# Patient Record
Sex: Female | Born: 1985 | Race: White | Hispanic: No | Marital: Single | Smoking: Current every day smoker
Health system: Southern US, Community
[De-identification: ages and names within clinical notes are randomized; demographics above are authoritative.]

## PROBLEM LIST (undated history)

## (undated) DIAGNOSIS — M26609 Unspecified temporomandibular joint disorder, unspecified side: Secondary | ICD-10-CM

## (undated) DIAGNOSIS — I10 Essential (primary) hypertension: Secondary | ICD-10-CM

---
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
## 2010-07-02 ENCOUNTER — Emergency Department (HOSPITAL_BASED_OUTPATIENT_CLINIC_OR_DEPARTMENT_OTHER)
Admission: EM | Admit: 2010-07-02 | Discharge: 2010-07-02 | Disposition: A | Discharge status: Home / Self Care | Payer: Self-pay | Attending: Emergency Medicine | Admitting: Emergency Medicine

## 2010-07-02 DIAGNOSIS — M545 Low back pain, unspecified: Secondary | ICD-10-CM | POA: Insufficient documentation

## 2012-04-06 ENCOUNTER — Encounter (HOSPITAL_BASED_OUTPATIENT_CLINIC_OR_DEPARTMENT_OTHER): Payer: Self-pay

## 2012-04-06 ENCOUNTER — Emergency Department (HOSPITAL_BASED_OUTPATIENT_CLINIC_OR_DEPARTMENT_OTHER)
Admission: EM | Admit: 2012-04-06 | Discharge: 2012-04-06 | Disposition: A | Discharge status: Home / Self Care | Payer: Self-pay | Attending: Emergency Medicine | Admitting: Emergency Medicine

## 2012-04-06 DIAGNOSIS — M26609 Unspecified temporomandibular joint disorder, unspecified side: Secondary | ICD-10-CM | POA: Insufficient documentation

## 2012-04-06 DIAGNOSIS — F17200 Nicotine dependence, unspecified, uncomplicated: Secondary | ICD-10-CM | POA: Insufficient documentation

## 2012-04-06 HISTORY — DX: Unspecified temporomandibular joint disorder, unspecified side: M26.609

## 2012-04-06 MED ORDER — IBUPROFEN 800 MG PO TABS: 800.0000 mg | ORAL_TABLET | Freq: Three times a day (TID) | ORAL | Status: DC | PRN

## 2012-04-06 MED ORDER — OXYCODONE-ACETAMINOPHEN 5-325 MG PO TABS
2.0000 | ORAL_TABLET | Freq: Once | ORAL | Status: AC
  Administered 2012-04-06: 2 via ORAL
  Filled 2012-04-06 (×2): qty 2

## 2012-04-06 MED ORDER — IBUPROFEN 800 MG PO TABS
800.0000 mg | ORAL_TABLET | Freq: Once | ORAL | Status: AC
  Administered 2012-04-06: 800 mg via ORAL
  Filled 2012-04-06: qty 1

## 2012-04-06 MED ORDER — OXYCODONE-ACETAMINOPHEN 5-325 MG PO TABS: 1.0000 | ORAL_TABLET | Freq: Four times a day (QID) | ORAL | Status: DC | PRN

## 2012-04-06 NOTE — ED Provider Notes (Signed)
History     CSN: [id number]  Arrival date & time 04/06/12  1213   First MD Initiated Contact with Patient 04/06/12 1231      Chief Complaint  Patient presents with  . Jaw Pain    (Consider location/radiation/quality/duration/timing/severity/associated sxs/prior treatment) HPI Pt with history of TMJ syndrome reports several days of severe aching pain in L TMJ, associated with clicking and worse with chewing. No swelling, fever, otalgia or drainage. Not improved with APAP at home.   Past Medical History  Diagnosis Date  . TMJ (dislocation of temporomandibular joint)     History reviewed. No pertinent past surgical history.  No family history on file.  History  Substance Use Topics  . Smoking status: Current Every Day Smoker  . Smokeless tobacco: Not on file  . Alcohol Use: No    OB History    Grav Para Term Preterm Abortions TAB SAB Ect Mult Living                  Review of Systems All other systems reviewed and are negative except as noted in HPI.   Allergies  Review of patient's allergies indicates no known allergies.  Home Medications   Current Outpatient Rx  Name  Route  Sig  Dispense  Refill  . ACETAMIN PO   Oral   Take by mouth.           BP 145/89  Pulse 83  Temp 97.7 F (36.5 C) (Oral)  Resp 16  Ht 5\' 4"  (1.626 m)  Wt 190 lb (86.183 kg)  BMI 32.61 kg/m2  SpO2 100%  LMP 03/14/2012  Physical Exam  Nursing note and vitals reviewed. Constitutional: She is oriented to person, place, and time. She appears well-developed and well-nourished.  HENT:  Head: Normocephalic and atraumatic.       TM normal, tender to L TMJ  Eyes: EOM are normal. Pupils are equal, round, and reactive to light.  Neck: Normal range of motion. Neck supple.  Cardiovascular: Normal rate, normal heart sounds and intact distal pulses.   Pulmonary/Chest: Effort normal and breath sounds normal.  Abdominal: Bowel sounds are normal. She exhibits no distension. There is no  tenderness.  Musculoskeletal: Normal range of motion. She exhibits no edema and no tenderness.  Neurological: She is alert and oriented to person, place, and time. She has normal strength. No cranial nerve deficit or sensory deficit.  Skin: Skin is warm and dry. No rash noted.  Psychiatric: She has a normal mood and affect.    ED Course  Procedures (including critical care time)  Labs Reviewed - No data to display No results found.   No diagnosis found.    MDM  Symptomatic relief and dental followup.         Charles B. Bernette Mayers, MD 04/06/12 1302

## 2012-04-06 NOTE — ED Notes (Signed)
Left side jaw pain "for weeks"-states she was dx with TMJ years ago

## 2012-10-09 ENCOUNTER — Encounter (HOSPITAL_BASED_OUTPATIENT_CLINIC_OR_DEPARTMENT_OTHER): Payer: Self-pay | Admitting: *Deleted

## 2012-10-09 ENCOUNTER — Emergency Department (HOSPITAL_BASED_OUTPATIENT_CLINIC_OR_DEPARTMENT_OTHER)
Admission: EM | Admit: 2012-10-09 | Discharge: 2012-10-09 | Disposition: A | Discharge status: Home / Self Care | Payer: Self-pay | Attending: Emergency Medicine | Admitting: Emergency Medicine

## 2012-10-09 ENCOUNTER — Emergency Department (HOSPITAL_BASED_OUTPATIENT_CLINIC_OR_DEPARTMENT_OTHER): Payer: Self-pay

## 2012-10-09 DIAGNOSIS — F17200 Nicotine dependence, unspecified, uncomplicated: Secondary | ICD-10-CM | POA: Insufficient documentation

## 2012-10-09 DIAGNOSIS — Y92009 Unspecified place in unspecified non-institutional (private) residence as the place of occurrence of the external cause: Secondary | ICD-10-CM | POA: Insufficient documentation

## 2012-10-09 DIAGNOSIS — IMO0002 Reserved for concepts with insufficient information to code with codable children: Secondary | ICD-10-CM | POA: Insufficient documentation

## 2012-10-09 DIAGNOSIS — S8391XA Sprain of unspecified site of right knee, initial encounter: Secondary | ICD-10-CM

## 2012-10-09 DIAGNOSIS — W010XXA Fall on same level from slipping, tripping and stumbling without subsequent striking against object, initial encounter: Secondary | ICD-10-CM | POA: Insufficient documentation

## 2012-10-09 DIAGNOSIS — Z79899 Other long term (current) drug therapy: Secondary | ICD-10-CM | POA: Insufficient documentation

## 2012-10-09 DIAGNOSIS — Z8739 Personal history of other diseases of the musculoskeletal system and connective tissue: Secondary | ICD-10-CM | POA: Insufficient documentation

## 2012-10-09 DIAGNOSIS — Y9389 Activity, other specified: Secondary | ICD-10-CM | POA: Insufficient documentation

## 2012-10-09 MED ORDER — OXYCODONE-ACETAMINOPHEN 5-325 MG PO TABS
2.0000 | ORAL_TABLET | Freq: Once | ORAL | Status: AC
  Administered 2012-10-09: 2 via ORAL
  Filled 2012-10-09 (×2): qty 2

## 2012-10-09 MED ORDER — NAPROXEN 500 MG PO TABS: 500.0000 mg | ORAL_TABLET | Freq: Two times a day (BID) | ORAL | Status: DC

## 2012-10-09 MED ORDER — OXYCODONE-ACETAMINOPHEN 5-325 MG PO TABS: 1.0000 | ORAL_TABLET | Freq: Four times a day (QID) | ORAL | Status: DC | PRN

## 2012-10-09 NOTE — ED Notes (Signed)
Pt states she was playing in yard with kids Sunday tripped and fell landing on right knee states she heard and felt it pop and give way. She has had swelling in knee and calf

## 2012-10-09 NOTE — ED Provider Notes (Signed)
History     CSN: [id number]  Arrival date & time 10/09/12  1241   First MD Initiated Contact with Patient 10/09/12 1254      Chief Complaint  Patient presents with  . Knee Pain    (Consider location/radiation/quality/duration/timing/severity/associated sxs/prior treatment) Patient is a 27 y.o. female presenting with knee pain. The history is provided by the patient. No language interpreter was used.  Knee Pain Location:  Knee Time since incident:  5 days Injury: yes   Mechanism of injury: fall   Fall:    Fall occurred:  Recreating/playing   Height of fall:  Ground level   Impact surface:  Grass   Point of impact:  Unable to specify   Entrapped after fall: no   Knee location:  R knee Pain details:    Quality:  Shooting   Radiates to:  Does not radiate   Severity:  Severe   Onset quality:  Sudden   Duration:  5 days   Timing:  Constant   Progression:  Worsening Chronicity:  New Dislocation: no   Foreign body present:  No foreign bodies Worsened by:  Bearing weight Ineffective treatments:  Rest Associated symptoms: decreased ROM and swelling     Past Medical History  Diagnosis Date  . TMJ (temporomandibular joint syndrome)     History reviewed. No pertinent past surgical history.  History reviewed. No pertinent family history.  History  Substance Use Topics  . Smoking status: Current Every Day Smoker  . Smokeless tobacco: Not on file  . Alcohol Use: No    OB History   Grav Para Term Preterm Abortions TAB SAB Ect Mult Living                  Review of Systems  Musculoskeletal: Positive for joint swelling and arthralgias.  All other systems reviewed and are negative.    Allergies  Review of patient's allergies indicates no known allergies.  Home Medications   Current Outpatient Rx  Name  Route  Sig  Dispense  Refill  . Acetaminophen (ACETAMIN PO)   Oral   Take by mouth.         Marland Kitchen ibuprofen (ADVIL,MOTRIN) 800 MG tablet   Oral   Take 1  tablet (800 mg total) by mouth every 8 (eight) hours as needed for pain.   30 tablet   0   . oxyCODONE-acetaminophen (PERCOCET/ROXICET) 5-325 MG per tablet   Oral   Take 1-2 tablets by mouth every 6 (six) hours as needed for pain.   20 tablet   0     BP 124/90  Pulse 89  Temp(Src) 98.5 F (36.9 C) (Oral)  SpO2 99%  LMP 09/11/2012  Physical Exam  Nursing note and vitals reviewed. Constitutional: She is oriented to person, place, and time. She appears well-developed and well-nourished.  HENT:  Head: Normocephalic and atraumatic.  Eyes: Pupils are equal, round, and reactive to light.  Neck: Normal range of motion.  Cardiovascular: Normal rate, regular rhythm and normal heart sounds.   Pulmonary/Chest: Effort normal and breath sounds normal.  Abdominal: Soft. Bowel sounds are normal.  Musculoskeletal: She exhibits edema and tenderness.       Right knee: She exhibits swelling. She exhibits no deformity. Tenderness found. MCL, LCL and patellar tendon tenderness noted.       Legs: Neurological: She is alert and oriented to person, place, and time.  Skin: Skin is warm and dry.  Psychiatric: She has a normal mood and affect.  Her behavior is normal. Judgment and thought content normal.    ED Course  Procedures (including critical care time)  Labs Reviewed - No data to display No results found.   No diagnosis found.  Joint effusion noted on knee films. Difficult to assess knee d/t pain, but suspect collateral ligament involvement.  Knee immobilizer, crutches, anti-inflammatory, orthopedic follow-up.  MDM          Jimmye Norman, NP 10/09/12 1625

## 2012-10-09 NOTE — ED Notes (Signed)
Pt refused crutches-states she has pair at home

## 2012-10-13 ENCOUNTER — Ambulatory Visit (INDEPENDENT_AMBULATORY_CARE_PROVIDER_SITE_OTHER): Payer: Self-pay | Admitting: Family Medicine

## 2012-10-13 ENCOUNTER — Encounter: Payer: Self-pay | Admitting: Family Medicine

## 2012-10-13 VITALS — BP 127/79 | HR 99 | Ht 64.0 in | Wt 180.0 lb

## 2012-10-13 DIAGNOSIS — M25561 Pain in right knee: Secondary | ICD-10-CM

## 2012-10-13 DIAGNOSIS — S8990XA Unspecified injury of unspecified lower leg, initial encounter: Secondary | ICD-10-CM

## 2012-10-13 DIAGNOSIS — M25569 Pain in unspecified knee: Secondary | ICD-10-CM

## 2012-10-13 DIAGNOSIS — S8991XA Unspecified injury of right lower leg, initial encounter: Secondary | ICD-10-CM

## 2012-10-13 MED ORDER — OXYCODONE-ACETAMINOPHEN 5-325 MG PO TABS: 1.0000 | ORAL_TABLET | Freq: Four times a day (QID) | ORAL | Status: DC | PRN

## 2012-10-13 NOTE — Patient Instructions (Addendum)
Your history and exam are consistent with an ACL and likely meniscal tear with this. We will move forward with an MRI - call us when this goes through so we can order it as fast as possible. Ice knee 15 minutes at a time 3-4 times a day. ACE wrap for compression. Knee immobilizer only if needed to help get around. Crutches as needed also. Aleve 2 tabs twice a day with food for pain and inflammation when you run out of the naproxen. Percocet as needed for severe pain. Start simple range of motion exercises twice a day. Straight leg raises 3 sets of 10 once a day also for strengthening.

## 2012-10-13 NOTE — ED Provider Notes (Signed)
Medical screening examination/treatment/procedure(s) were performed by non-physician practitioner and as supervising physician I was immediately available for consultation/collaboration.  Noya Santarelli, MD 10/13/12 1419 

## 2012-10-14 ENCOUNTER — Encounter: Payer: Self-pay | Admitting: Family Medicine

## 2012-10-14 DIAGNOSIS — S8991XA Unspecified injury of right lower leg, initial encounter: Secondary | ICD-10-CM | POA: Insufficient documentation

## 2012-10-14 NOTE — Assessment & Plan Note (Signed)
Concerning for meniscal and ACL injuries.  Lack of insurance is a major barrier to care -  Had medicaid and has reapplied.  Will call us when this goes through to go ahead with an MRI.  Radiographs in ED negative for fracture.  In meantime icing, ace wrap, immobilizer only if needed.  Elevation, aleve and percocet for pain.  Start simple ROM exercises and quad strengthening.

## 2012-10-14 NOTE — Progress Notes (Signed)
Patient ID: Robin Farmer, female   DOB: 1985-08-27, 27 y.o.   MRN: [id number]  PCP: No primary provider on file.  Subjective:   HPI: Patient is a 27 y.o. female here for right knee injury.  Patient reports on 5/25 she was chasing her child, running with flip flops on and stated she fell. Happened quickly so she's not completely sure how she injured herself. Believes feet went out from under her and she either twisted or hyperextended right knee. Unable to put weight on left leg after this due to knee pain. Immediate swelling that has gotten worse. Been icing and using crutches. Elevating. Taking ibuprofen and pain medication from ED. No prior knee injuries. No catching, locking but feels unstable.  Past Medical History  Diagnosis Date  . TMJ (temporomandibular joint syndrome)     Current Outpatient Prescriptions on File Prior to Visit  Medication Sig Dispense Refill  . Acetaminophen (ACETAMIN PO) Take by mouth.      Marland Kitchen ibuprofen (ADVIL,MOTRIN) 800 MG tablet Take 1 tablet (800 mg total) by mouth every 8 (eight) hours as needed for pain.  30 tablet  0  . naproxen (NAPROSYN) 500 MG tablet Take 1 tablet (500 mg total) by mouth 2 (two) times daily.  20 tablet  0   No current facility-administered medications on file prior to visit.    History reviewed. No pertinent past surgical history.  No Known Allergies  History   Social History  . Marital Status: Single    Spouse Name: N/A    Number of Children: N/A  . Years of Education: N/A   Occupational History  . Not on file.   Social History Main Topics  . Smoking status: Current Every Day Smoker  . Smokeless tobacco: Not on file  . Alcohol Use: No  . Drug Use: Not on file  . Sexually Active: Not on file   Other Topics Concern  . Not on file   Social History Narrative  . No narrative on file    Family History  Problem Relation Age of Onset  . Hyperlipidemia Father   . Hypertension Father   . Diabetes Neg Hx    . Heart attack Neg Hx   . Sudden death Neg Hx     BP 127/79  Pulse 99  Ht 5\' 4"  (1.626 m)  Wt 180 lb (81.647 kg)  BMI 30.88 kg/m2  LMP 09/11/2012  Review of Systems: See HPI above.    Objective:  Physical Exam:  Gen: NAD though tearful during exam.  R knee: Mod effusion.  No warmth, erythema. TTP diffusely across anterior knee especially at joint lines. ROM 0-90 degrees. 1+ ant drawer and lachmanns but also guarding.  Negative post drawer. Negative valgus/varus testing. Negative lachmanns. Pain with mcmurrays and apleys but not localized to one joint line. Negative patellar apprehension, clarkes. NV intact distally.    Assessment & Plan:  1. Right knee injury - Concerning for meniscal and ACL injuries.  Lack of insurance is a major barrier to care -  Had medicaid and has reapplied.  Will call us when this goes through to go ahead with an MRI.  Radiographs in ED negative for fracture.  In meantime icing, ace wrap, immobilizer only if needed.  Elevation, aleve and percocet for pain.  Start simple ROM exercises and quad strengthening.

## 2012-10-20 ENCOUNTER — Ambulatory Visit: Payer: Self-pay

## 2012-11-04 ENCOUNTER — Encounter: Payer: Self-pay | Admitting: Family Medicine

## 2012-11-04 ENCOUNTER — Ambulatory Visit (INDEPENDENT_AMBULATORY_CARE_PROVIDER_SITE_OTHER): Payer: Self-pay | Admitting: Family Medicine

## 2012-11-04 VITALS — BP 125/84 | HR 89 | Ht 64.0 in | Wt 180.0 lb

## 2012-11-04 DIAGNOSIS — S8991XD Unspecified injury of right lower leg, subsequent encounter: Secondary | ICD-10-CM

## 2012-11-04 DIAGNOSIS — Z5189 Encounter for other specified aftercare: Secondary | ICD-10-CM

## 2012-11-04 NOTE — Assessment & Plan Note (Signed)
Concerning for meniscal and ACL injuries.  Lack of insurance is a major barrier to care -  Waiting on medicaid to go through and waiting on paperwork to file for cone coverage.  Will call us when this goes through to go ahead with an MRI.  Radiographs in ED negative for fracture.  In meantime icing, ace wrap, immobilizer only if needed.  Elevation, aleve for pain.  Injection given today.  ROM exercises and quad strengthening.  After informed written consent, patient was seated on exam table. Right knee was prepped with alcohol swab and utilizing anteromedial approach, patient's right knee was injected intraarticularly with 3:1 marcaine: depomedrol. Patient tolerated the procedure well without immediate complications.

## 2012-11-04 NOTE — Progress Notes (Signed)
Patient ID: Robin Farmer, female   DOB: 1985-08-31, 27 y.o.   MRN: [id number]  PCP: No primary provider on file.  Subjective:   HPI: Patient is a 27 y.o. female here for f/u right knee injury.  6/2: Patient reports on 5/25 she was chasing her child, running with flip flops on and stated she fell. Happened quickly so she's not completely sure how she injured herself. Believes feet went out from under her and she either twisted or hyperextended right knee. Unable to put weight on left leg after this due to knee pain. Immediate swelling that has gotten worse. Been icing and using crutches. Elevating. Taking ibuprofen and pain medication from ED. No prior knee injuries. No catching, locking but feels unstable.  6/24: Patient returns today as still feels about the same, has not gone through with cone coverage yet - states is waiting on a paper from child support.  Medicaid is still pending. Unable to get MRI yet - discussed tramadol or injection - has tramadol at home - here for injection.  Past Medical History  Diagnosis Date  . TMJ (temporomandibular joint syndrome)     Current Outpatient Prescriptions on File Prior to Visit  Medication Sig Dispense Refill  . Acetaminophen (ACETAMIN PO) Take by mouth.      Marland Kitchen ibuprofen (ADVIL,MOTRIN) 800 MG tablet Take 1 tablet (800 mg total) by mouth every 8 (eight) hours as needed for pain.  30 tablet  0  . naproxen (NAPROSYN) 500 MG tablet Take 1 tablet (500 mg total) by mouth 2 (two) times daily.  20 tablet  0  . oxyCODONE-acetaminophen (PERCOCET/ROXICET) 5-325 MG per tablet Take 1 tablet by mouth every 6 (six) hours as needed for pain.  60 tablet  0   No current facility-administered medications on file prior to visit.    History reviewed. No pertinent past surgical history.  No Known Allergies  History   Social History  . Marital Status: Single    Spouse Name: N/A    Number of Children: N/A  . Years of Education: N/A    Occupational History  . Not on file.   Social History Main Topics  . Smoking status: Current Every Day Smoker  . Smokeless tobacco: Not on file  . Alcohol Use: No  . Drug Use: Not on file  . Sexually Active: Not on file   Other Topics Concern  . Not on file   Social History Narrative  . No narrative on file    Family History  Problem Relation Age of Onset  . Hyperlipidemia Father   . Hypertension Father   . Diabetes Neg Hx   . Heart attack Neg Hx   . Sudden death Neg Hx     BP 125/84  Pulse 89  Ht 5\' 4"  (1.626 m)  Wt 180 lb (81.647 kg)  BMI 30.88 kg/m2  LMP 09/11/2012  Review of Systems: See HPI above.    Objective:  Physical Exam: Exam not repeated today. Gen: NAD though tearful during exam.  R knee: Mod effusion.  No warmth, erythema. TTP diffusely across anterior knee especially at joint lines. ROM 0-90 degrees. 1+ ant drawer and lachmanns but also guarding.  Negative post drawer. Negative valgus/varus testing. Negative lachmanns. Pain with mcmurrays and apleys but not localized to one joint line. Negative patellar apprehension, clarkes. NV intact distally.  MSK u/s: Minimal effusion.  Assessment & Plan:  1. Right knee injury - Concerning for meniscal and ACL injuries.  Lack of  insurance is a major barrier to care -  Waiting on medicaid to go through and waiting on paperwork to file for cone coverage.  Will call us when this goes through to go ahead with an MRI.  Radiographs in ED negative for fracture.  In meantime icing, ace wrap, immobilizer only if needed.  Elevation, aleve for pain.  Injection given today.  ROM exercises and quad strengthening.  After informed written consent, patient was seated on exam table. Right knee was prepped with alcohol swab and utilizing anteromedial approach, patient's right knee was injected intraarticularly with 3:1 marcaine: depomedrol. Patient tolerated the procedure well without immediate complications.

## 2013-11-26 ENCOUNTER — Emergency Department (HOSPITAL_BASED_OUTPATIENT_CLINIC_OR_DEPARTMENT_OTHER)
Admission: EM | Admit: 2013-11-26 | Discharge: 2013-11-26 | Disposition: A | Discharge status: Home / Self Care | Payer: Self-pay | Attending: Emergency Medicine | Admitting: Emergency Medicine

## 2013-11-26 ENCOUNTER — Encounter (HOSPITAL_BASED_OUTPATIENT_CLINIC_OR_DEPARTMENT_OTHER): Payer: Self-pay | Admitting: Emergency Medicine

## 2013-11-26 DIAGNOSIS — M545 Low back pain, unspecified: Secondary | ICD-10-CM | POA: Insufficient documentation

## 2013-11-26 DIAGNOSIS — M25561 Pain in right knee: Secondary | ICD-10-CM

## 2013-11-26 DIAGNOSIS — Z8719 Personal history of other diseases of the digestive system: Secondary | ICD-10-CM | POA: Insufficient documentation

## 2013-11-26 DIAGNOSIS — M5442 Lumbago with sciatica, left side: Secondary | ICD-10-CM

## 2013-11-26 DIAGNOSIS — F17200 Nicotine dependence, unspecified, uncomplicated: Secondary | ICD-10-CM | POA: Insufficient documentation

## 2013-11-26 DIAGNOSIS — Z791 Long term (current) use of non-steroidal anti-inflammatories (NSAID): Secondary | ICD-10-CM | POA: Insufficient documentation

## 2013-11-26 MED ORDER — OXYCODONE-ACETAMINOPHEN 5-325 MG PO TABS: 1.0000 | ORAL_TABLET | Freq: Four times a day (QID) | ORAL | Status: DC | PRN

## 2013-11-26 MED ORDER — HYDROMORPHONE HCL PF 2 MG/ML IJ SOLN
2.0000 mg | Freq: Once | INTRAMUSCULAR | Status: AC
  Administered 2013-11-26: 2 mg via INTRAMUSCULAR
  Filled 2013-11-26: qty 1

## 2013-11-26 NOTE — ED Provider Notes (Signed)
CSN: [id number]     Arrival date & time 11/26/13  0509 History   First MD Initiated Contact with Patient 11/26/13 [phone]     Chief Complaint  Patient presents with  . Back Pain     (Consider location/radiation/quality/duration/timing/severity/associated sxs/prior Treatment) HPI This is a 28 year old female with a remote history of back pain. She is here with a two-day history of pain across her lower back sometimes radiating to her left lateral thigh. She is unaware of any injury that triggered this although she does lift frequently at work. She states the pain is severe and has prevented her from sleeping for the past 2 nights. There is no associated numbness or weakness. There are no associated bowel or bladder changes. She has difficulty finding a comfortable position. She has been taking Tylenol without relief. She does not like taking ibuprofen because it makes her feel funny.   Past Medical History  Diagnosis Date  . TMJ (temporomandibular joint syndrome)    History reviewed. No pertinent past surgical history. Family History  Problem Relation Age of Onset  . Hyperlipidemia Father   . Hypertension Father   . Diabetes Neg Hx   . Heart attack Neg Hx   . Sudden death Neg Hx    History  Substance Use Topics  . Smoking status: Current Every Day Smoker  . Smokeless tobacco: Not on file  . Alcohol Use: No   OB History   Grav Para Term Preterm Abortions TAB SAB Ect Mult Living                 Review of Systems  All other systems reviewed and are negative.   Allergies  Review of patient's allergies indicates no known allergies.  Home Medications   Prior to Admission medications   Medication Sig Start Date End Date Taking? Authorizing Provider  Acetaminophen (ACETAMIN PO) Take by mouth.    Historical Provider, MD  ibuprofen (ADVIL,MOTRIN) 800 MG tablet Take 1 tablet (800 mg total) by mouth every 8 (eight) hours as needed for pain. 04/06/12   Charles B. Bernette MayersSheldon, MD   naproxen (NAPROSYN) 500 MG tablet Take 1 tablet (500 mg total) by mouth 2 (two) times daily. 10/09/12   Jimmye Normanavid Nilza Eaker Smith, NP  oxyCODONE-acetaminophen (PERCOCET/ROXICET) 5-325 MG per tablet Take 1 tablet by mouth every 6 (six) hours as needed for pain. 10/13/12   Lenda KelpShane R Hudnall, MD   BP 129/86  Pulse 70  Temp(Src) 97.9 F (36.6 C) (Oral)  Resp 20  Ht 5\' 4"  (1.626 m)  Wt 195 lb (88.451 kg)  BMI 33.46 kg/m2  SpO2 100%  LMP 11/19/2013  Physical Exam General: Well-developed, well-nourished female in no acute distress; appearance consistent with age of record HENT: normocephalic; atraumatic Eyes: pupils equal, round and reactive to light; extraocular muscles intact Neck: supple Heart: regular rate and rhythm Lungs: clear to auscultation bilaterally Abdomen: soft; nondistended; nontender Back: Bilateral lower back tenderness; pain on straight leg raise right at about 75; pain on straight leg raise left at about 60 Extremities: No deformity; full range of motion; pulses normal Neurologic: Awake, alert and oriented; motor function intact in all extremities and symmetric; sensation intact in lower extremities and symmetric; no facial droop Skin: Warm and dry Psychiatric: Distracted by pain; difficulty focusing on answering questions in detail   ED Course  Procedures (including critical care time)  MDM       Hanley SeamenJohn L Ethne Jeon, MD 11/26/13 [phone]

## 2013-11-26 NOTE — ED Notes (Signed)
Pt c/o lower back pain radiating down lt leg x2days, denies injury

## 2014-06-24 ENCOUNTER — Emergency Department (HOSPITAL_BASED_OUTPATIENT_CLINIC_OR_DEPARTMENT_OTHER)
Admission: EM | Admit: 2014-06-24 | Discharge: 2014-06-24 | Disposition: A | Discharge status: Home / Self Care | Payer: Self-pay | Attending: Emergency Medicine | Admitting: Emergency Medicine

## 2014-06-24 ENCOUNTER — Encounter (HOSPITAL_BASED_OUTPATIENT_CLINIC_OR_DEPARTMENT_OTHER): Payer: Self-pay

## 2014-06-24 DIAGNOSIS — M5432 Sciatica, left side: Secondary | ICD-10-CM | POA: Insufficient documentation

## 2014-06-24 DIAGNOSIS — Z791 Long term (current) use of non-steroidal anti-inflammatories (NSAID): Secondary | ICD-10-CM | POA: Insufficient documentation

## 2014-06-24 DIAGNOSIS — Z720 Tobacco use: Secondary | ICD-10-CM | POA: Insufficient documentation

## 2014-06-24 MED ORDER — OXYCODONE-ACETAMINOPHEN 5-325 MG PO TABS: 1.0000 | ORAL_TABLET | Freq: Four times a day (QID) | ORAL | Status: DC | PRN

## 2014-06-24 MED ORDER — HYDROMORPHONE HCL 1 MG/ML IJ SOLN
1.0000 mg | Freq: Once | INTRAMUSCULAR | Status: AC
  Administered 2014-06-24: 1 mg via INTRAMUSCULAR
  Filled 2014-06-24: qty 1

## 2014-06-24 NOTE — ED Notes (Signed)
Pt reports intermittent lower back pain for one week. Denies known injury. States sharp pain across lower back with radiation through posterior left thigh.

## 2014-06-24 NOTE — ED Provider Notes (Signed)
CSN: [id number]     Arrival date & time 06/24/14  [id number] History   First MD Initiated Contact with Patient 06/24/14 [phone]     Chief Complaint  Patient presents with  . Back Pain     (Consider location/radiation/quality/duration/timing/severity/associated sxs/prior Treatment) Patient is a 29 y.o. female presenting with back pain. The history is provided by the patient.  Back Pain Location:  Lumbar spine Quality:  Aching Radiates to:  L thigh Pain severity:  Moderate Pain is:  Same all the time Onset quality:  Gradual Duration:  1 week Timing:  Constant Progression:  Unchanged Chronicity:  Recurrent Context comment:  Chronic recurrent low back pain Relieved by:  Nothing Worsened by:  Movement Ineffective treatments: tylenol. Associated symptoms: no abdominal pain, no chest pain, no dysuria, no fever and no headaches     Past Medical History  Diagnosis Date  . TMJ (temporomandibular joint syndrome)    History reviewed. No pertinent past surgical history. Family History  Problem Relation Age of Onset  . Hyperlipidemia Father   . Hypertension Father   . Diabetes Neg Hx   . Heart attack Neg Hx   . Sudden death Neg Hx    History  Substance Use Topics  . Smoking status: Current Every Day Smoker  . Smokeless tobacco: Not on file  . Alcohol Use: No   OB History    No data available     Review of Systems  Constitutional: Negative for fever and fatigue.  HENT: Negative for congestion and drooling.   Eyes: Negative for pain.  Respiratory: Negative for cough and shortness of breath.   Cardiovascular: Negative for chest pain.  Gastrointestinal: Negative for nausea, vomiting, abdominal pain and diarrhea.  Genitourinary: Negative for dysuria and hematuria.  Musculoskeletal: Positive for back pain. Negative for gait problem and neck pain.  Skin: Negative for color change.  Neurological: Negative for dizziness and headaches.  Hematological: Negative for adenopathy.   Psychiatric/Behavioral: Negative for behavioral problems.  All other systems reviewed and are negative.     Allergies  Review of patient's allergies indicates no known allergies.  Home Medications   Prior to Admission medications   Medication Sig Start Date End Date Taking? Authorizing Provider  Acetaminophen (ACETAMIN PO) Take by mouth.    Historical Provider, MD  ibuprofen (ADVIL,MOTRIN) 800 MG tablet Take 1 tablet (800 mg total) by mouth every 8 (eight) hours as needed for pain. 04/06/12   Charles B. Bernette MayersSheldon, MD  naproxen (NAPROSYN) 500 MG tablet Take 1 tablet (500 mg total) by mouth 2 (two) times daily. 10/09/12   Jimmye Normanavid John Smith, NP  oxyCODONE-acetaminophen (PERCOCET) 5-325 MG per tablet Take 1 tablet by mouth every 6 (six) hours as needed for moderate pain. 06/24/14   Purvis SheffieldForrest Jaely Silman, MD   BP 137/90 mmHg  Pulse 92  Temp(Src) 97.7 F (36.5 C)  Resp 20  Ht 5\' 4"  (1.626 m)  Wt 214 lb 14.4 oz (97.478 kg)  BMI 36.87 kg/m2  SpO2 100% Physical Exam  Constitutional: She is oriented to person, place, and time. She appears well-developed and well-nourished.  HENT:  Head: Normocephalic.  Mouth/Throat: No oropharyngeal exudate.  Eyes: Conjunctivae and EOM are normal. Pupils are equal, round, and reactive to light.  Neck: Normal range of motion. Neck supple.  Cardiovascular: Normal rate, regular rhythm, normal heart sounds and intact distal pulses.  Exam reveals no gallop and no friction rub.   No murmur heard. Pulmonary/Chest: Effort normal and breath sounds normal. No respiratory distress.  She has no wheezes.  Abdominal: Soft. Bowel sounds are normal. There is no tenderness. There is no rebound and no guarding.  Musculoskeletal: Normal range of motion. She exhibits tenderness. She exhibits no edema.  Diffuse tenderness of the lower lumbar spine and lumbar paraspinal area bilaterally.  Normal strength and sensation in bilateral lower extremities.  Neurological: She is alert and  oriented to person, place, and time.  Reflex Scores:      Patellar reflexes are 2+ on the right side and 2+ on the left side.      Achilles reflexes are 2+ on the right side and 2+ on the left side. Skin: Skin is warm and dry.  Psychiatric: She has a normal mood and affect. Her behavior is normal.  Nursing note and vitals reviewed.   ED Course  Procedures (including critical care time) Labs Review Labs Reviewed - No data to display  Imaging Review No results found.   EKG Interpretation None      MDM   Final diagnoses:  Sciatica, left    9:22 AM 29 y.o. female with a history of left-sided sciatica pain presents with low back pain radiating down the posterior aspect of her left thigh. She also has some mild paresthesias of the posterior left thigh. She denies any fevers, weakness, bowel or bladder incontinence, or other back pain red flags. She is ambulatory and has normal strength on exam. She's been seen for this once previously here.  She denies any specific injury. Do not think plain films of the helpful. Recommended follow-up with a primary care doctor. Back pain red flags discussed and recommend return for any worsening.    Purvis Sheffield, MD 06/24/14 [phone]

## 2014-06-24 NOTE — Discharge Instructions (Signed)
Back Pain, Adult °Back pain is very common. The pain often gets better over time. The cause of back pain is usually not dangerous. Most people can learn to manage their back pain on their own.  °HOME CARE  °· Stay active. Start with short walks on flat ground if you can. Try to walk farther each day. °· Do not sit, drive, or stand in one place for more than 30 minutes. Do not stay in bed. °· Do not avoid exercise or work. Activity can help your back heal faster. °· Be careful when you bend or lift an object. Bend at your knees, keep the object close to you, and do not twist. °· Sleep on a firm mattress. Lie on your side, and bend your knees. If you lie on your back, put a pillow under your knees. °· Only take medicines as told by your doctor. °· Put ice on the injured area. °¨ Put ice in a plastic bag. °¨ Place a towel between your skin and the bag. °¨ Leave the ice on for 15-20 minutes, 03-04 times a day for the first 2 to 3 days. After that, you can switch between ice and heat packs. °· Ask your doctor about back exercises or massage. °· Avoid feeling anxious or stressed. Find good ways to deal with stress, such as exercise. °GET HELP RIGHT AWAY IF:  °· Your pain does not go away with rest or medicine. °· Your pain does not go away in 1 week. °· You have new problems. °· You do not feel well. °· The pain spreads into your legs. °· You cannot control when you poop (bowel movement) or pee (urinate). °· Your arms or legs feel weak or lose feeling (numbness). °· You feel sick to your stomach (nauseous) or throw up (vomit). °· You have belly (abdominal) pain. °· You feel like you may pass out (faint). °MAKE SURE YOU:  °· Understand these instructions. °· Will watch your condition. °· Will get help right away if you are not doing well or get worse. °Document Released: 10/17/2007 Document Revised: 07/23/2011 Document Reviewed: 09/01/2013 °Exit[employer]® Patient Information ©2015 [employer]. This information is not intended  to replace advice given to you by your health care provider. Make sure you discuss any questions you have with your health care provider. ° °

## 2015-03-03 ENCOUNTER — Encounter (HOSPITAL_BASED_OUTPATIENT_CLINIC_OR_DEPARTMENT_OTHER): Payer: Self-pay | Admitting: *Deleted

## 2015-03-03 ENCOUNTER — Emergency Department (HOSPITAL_BASED_OUTPATIENT_CLINIC_OR_DEPARTMENT_OTHER)
Admission: EM | Admit: 2015-03-03 | Discharge: 2015-03-03 | Disposition: A | Discharge status: Home / Self Care | Payer: Self-pay | Attending: Emergency Medicine | Admitting: Emergency Medicine

## 2015-03-03 ENCOUNTER — Emergency Department (HOSPITAL_BASED_OUTPATIENT_CLINIC_OR_DEPARTMENT_OTHER): Payer: Self-pay

## 2015-03-03 DIAGNOSIS — Z791 Long term (current) use of non-steroidal anti-inflammatories (NSAID): Secondary | ICD-10-CM | POA: Insufficient documentation

## 2015-03-03 DIAGNOSIS — Y9389 Activity, other specified: Secondary | ICD-10-CM | POA: Insufficient documentation

## 2015-03-03 DIAGNOSIS — W108XXA Fall (on) (from) other stairs and steps, initial encounter: Secondary | ICD-10-CM | POA: Insufficient documentation

## 2015-03-03 DIAGNOSIS — Z720 Tobacco use: Secondary | ICD-10-CM | POA: Insufficient documentation

## 2015-03-03 DIAGNOSIS — Z8739 Personal history of other diseases of the musculoskeletal system and connective tissue: Secondary | ICD-10-CM | POA: Insufficient documentation

## 2015-03-03 DIAGNOSIS — S8391XA Sprain of unspecified site of right knee, initial encounter: Secondary | ICD-10-CM | POA: Insufficient documentation

## 2015-03-03 DIAGNOSIS — Y998 Other external cause status: Secondary | ICD-10-CM | POA: Insufficient documentation

## 2015-03-03 DIAGNOSIS — Y9289 Other specified places as the place of occurrence of the external cause: Secondary | ICD-10-CM | POA: Insufficient documentation

## 2015-03-03 MED ORDER — HYDROCODONE-ACETAMINOPHEN 5-325 MG PO TABS: ORAL_TABLET | ORAL | Status: DC

## 2015-03-03 MED ORDER — OXYCODONE-ACETAMINOPHEN 5-325 MG PO TABS
1.0000 | ORAL_TABLET | Freq: Once | ORAL | Status: AC
Start: 2015-03-03 — End: 2015-03-03
  Administered 2015-03-03: 1 via ORAL
  Filled 2015-03-03: qty 1

## 2015-03-03 NOTE — ED Provider Notes (Signed)
CSN: [id number]     Arrival date & time 03/03/15  1337 History   First MD Initiated Contact with Patient 03/03/15 1401     Chief Complaint  Patient presents with  . Knee Pain     (Consider location/radiation/quality/duration/timing/severity/associated sxs/prior Treatment) HPI  Blood pressure 136/93, pulse 77, temperature 98.2 F (36.8 C), temperature source Oral, resp. rate 18, height  (1.626 m), weight 214 lb (97.07 kg), last menstrual period 02/10/2015, SpO2 98 %.  Robin Farmer is a 29 y.o. female complaining of severe pain to right knee after patient slipped and fell while carrying a couch up wooden steps several hours ago. Patient has not had any pain medication taken prior to arrival. She is ambulatory but with pain. She had a prior meniscal injury to the right knee that was treated without surgery. She rates her pain at 9 out of 10 and states is exacerbated by moving and climbing steps.  Past Medical History  Diagnosis Date  . TMJ (temporomandibular joint syndrome)    History reviewed. No pertinent past surgical history. Family History  Problem Relation Age of Onset  . Hyperlipidemia Father   . Hypertension Father   . Diabetes Neg Hx   . Heart attack Neg Hx   . Sudden death Neg Hx    Social History  Substance Use Topics  . Smoking status: Current Every Day Smoker  . Smokeless tobacco: None  . Alcohol Use: No   OB History    No data available     Review of Systems  10 systems reviewed and found to be negative, except as noted in the HPI.  Allergies  Review of patient's allergies indicates no known allergies.  Home Medications   Prior to Admission medications   Medication Sig Start Date End Date Taking? Authorizing Provider  Acetaminophen (ACETAMIN PO) Take by mouth.    Historical Provider, MD  ibuprofen (ADVIL,MOTRIN) 800 MG tablet Take 1 tablet (800 mg total) by mouth every 8 (eight) hours as needed for pain. 04/06/12   Susy Frizzle, MD   naproxen (NAPROSYN) 500 MG tablet Take 1 tablet (500 mg total) by mouth 2 (two) times daily. 10/09/12   Felicie Morn, NP  oxyCODONE-acetaminophen (PERCOCET) 5-325 MG per tablet Take 1 tablet by mouth every 6 (six) hours as needed for moderate pain. 06/24/14   Purvis Sheffield, MD   BP 139/92 mmHg  Pulse 97  Temp(Src) 98.2 F (36.8 C) (Oral)  Resp 20  Ht  (1.626 m)  Wt 214 lb (97.07 kg)  BMI 36.72 kg/m2  SpO2 100%  LMP 02/10/2015 Physical Exam  Constitutional: She is oriented to person, place, and time. She appears well-developed and well-nourished. No distress.  HENT:  Head: Normocephalic and atraumatic.  Mouth/Throat: Oropharynx is clear and moist.  Eyes: Conjunctivae and EOM are normal. Pupils are equal, round, and reactive to light.  Neck: Normal range of motion.  Cardiovascular: Normal rate and regular rhythm.   Pulmonary/Chest: Effort normal and breath sounds normal. No stridor. No respiratory distress. She has no wheezes. She has no rales. She exhibits no tenderness.  Abdominal: Soft. Bowel sounds are normal. She exhibits no distension and no mass. There is no tenderness. There is no rebound and no guarding.  Musculoskeletal: Normal range of motion. She exhibits tenderness.  Right knee:  No deformity, erythema or abrasions. Diffuse nascent contusion, very tender to palpation along the medial joint line. FROM with pain. Marland Kitchen Anterior and posterior drawer show no abnormal laxity. Stable  to valgus and varus stress. Joint lines are non-tender. Neurovascularly intact.    Neurological: She is alert and oriented to person, place, and time.  Psychiatric: She has a normal mood and affect.  Nursing note and vitals reviewed.   ED Course  Procedures (including critical care time) Labs Review Labs Reviewed - No data to display  Imaging Review No results found. I have personally reviewed and evaluated these images and lab results as part of my medical decision-making.   EKG  Interpretation None      MDM   Final diagnoses:  Right knee sprain, initial encounter    Filed Vitals:   03/03/15 1354 03/03/15 1453  BP: 139/92 136/93  Pulse: 97 77  Temp: 98.2 F (36.8 C)   TempSrc: Oral   Resp: 20 18  Height: 5\' 4"  (1.626 m)   Weight: 214 lb (97.07 kg)   SpO2: 100% 98%    Medications  oxyCODONE-acetaminophen (PERCOCET/ROXICET) 5-325 MG per tablet 1 tablet (1 tablet Oral Given 03/03/15 1450)    Robin Farmer is 29 y.o. female presenting with right knee pain and contusion. Distally neurovascularly intact. X-ray negative. Pain is severe. Will give knee immobilizer crutches and sports medicine referral.   Evaluation does not show pathology that would require ongoing emergent intervention or inpatient treatment. Pt is hemodynamically stable and mentating appropriately. Discussed findings and plan with patient/guardian, who agrees with care plan. All questions answered. Return precautions discussed and outpatient follow up given.   Discharge Medication List as of 03/03/2015  2:38 PM    START taking these medications   Details  HYDROcodone-acetaminophen (NORCO/VICODIN) 5-325 MG tablet Take 1-2 tablets by mouth every 6 hours as needed for pain and/or cough., Print             Robin Emeryicole Elijio Staples, PA-C 03/03/15 1527  Mirian MoMatthew Gentry, MD 03/10/15 [phone]

## 2015-03-03 NOTE — Discharge Instructions (Signed)
Rest, Ice intermittently (in the first 24-48 hours), Gentle compression with an Ace wrap, and elevate (Limb above the level of the heart)   Take up to 800mg  of ibuprofen (that is usually 4 over the counter pills)  3 times a day for 5 days. Take with food.  Take vicodin for breakthrough pain, do not drink alcohol, drive, care for children or do other critical tasks while taking vicodin.   Knee Sprain A knee sprain is a tear in one of the strong, fibrous tissues that connect the bones (ligaments) in your knee. The severity of the sprain depends on how much of the ligament is torn. The tear can be either partial or complete. CAUSES  Often, sprains are a result of a fall or injury. The force of the impact causes the fibers of your ligament to stretch too much. This excess tension causes the fibers of your ligament to tear. SIGNS AND SYMPTOMS  You may have some loss of motion in your knee. Other symptoms include:  Bruising.  Pain in the knee area.  Tenderness of the knee to the touch.  Swelling. DIAGNOSIS  To diagnose a knee sprain, your health care provider will physically examine your knee. Your health care provider may also suggest an X-ray exam of your knee to make sure no bones are broken. TREATMENT  If your ligament is only partially torn, treatment usually involves keeping the knee in a fixed position (immobilization) or bracing your knee for activities that require movement for several weeks. To do this, your health care provider will apply a bandage, cast, or splint to keep your knee from moving and to support your knee during movement until it heals. For a partially torn ligament, the healing process usually takes 4-6 weeks. If your ligament is completely torn, depending on which ligament it is, you may need surgery to reconnect the ligament to the bone or reconstruct it. After surgery, a cast or splint may be applied and will need to stay on your knee for 4-6 weeks while your ligament  heals. HOME CARE INSTRUCTIONS  Keep your injured knee elevated to decrease swelling.  To ease pain and swelling, apply ice to the injured area:  Put ice in a plastic bag.  Place a towel between your skin and the bag.  Leave the ice on for 20 minutes, 2-3 times a day.  Only take medicine for pain as directed by your health care provider.  Do not leave your knee unprotected until pain and stiffness go away (usually 4-6 weeks).  If you have a cast or splint, do not allow it to get wet. If you have been instructed not to remove it, cover it with a plastic bag when you shower or bathe. Do not swim.  Your health care provider may suggest exercises for you to do during your recovery to prevent or limit permanent weakness and stiffness. SEEK IMMEDIATE MEDICAL CARE IF:  Your cast or splint becomes damaged.  Your pain becomes worse.  You have significant pain, swelling, or numbness below the cast or splint. MAKE SURE YOU:  Understand these instructions.  Will watch your condition.  Will get help right away if you are not doing well or get worse.   This information is not intended to replace advice given to you by your health care provider. Make sure you discuss any questions you have with your health care provider.   Document Released: 04/30/2005 Document Revised: 05/21/2014 Document Reviewed: 12/10/2012 Elsevier Interactive Patient Education 2016  Elsevier [employer]. ° °

## 2015-03-03 NOTE — ED Notes (Signed)
Right knee injury. She fell on a wooden step while moving furniture up the steps. Swelling and pain.

## 2015-03-03 NOTE — ED Notes (Signed)
Patient transported to and from x-ray.

## 2015-10-03 ENCOUNTER — Encounter (HOSPITAL_BASED_OUTPATIENT_CLINIC_OR_DEPARTMENT_OTHER): Payer: Self-pay | Admitting: *Deleted

## 2015-10-03 DIAGNOSIS — F17200 Nicotine dependence, unspecified, uncomplicated: Secondary | ICD-10-CM | POA: Insufficient documentation

## 2015-10-03 DIAGNOSIS — N12 Tubulo-interstitial nephritis, not specified as acute or chronic: Secondary | ICD-10-CM | POA: Insufficient documentation

## 2015-10-03 LAB — URINALYSIS, ROUTINE W REFLEX MICROSCOPIC
BILIRUBIN URINE: NEGATIVE
Glucose, UA: NEGATIVE mg/dL
KETONES UR: NEGATIVE mg/dL
Nitrite: NEGATIVE
Protein, ur: 100 mg/dL — AB
SPECIFIC GRAVITY, URINE: 1.009 (ref 1.005–1.030)
pH: 6 (ref 5.0–8.0)

## 2015-10-03 LAB — URINE MICROSCOPIC-ADD ON

## 2015-10-03 LAB — PREGNANCY, URINE: PREG TEST UR: NEGATIVE

## 2015-10-03 MED ORDER — ACETAMINOPHEN 325 MG PO TABS
650.0000 mg | ORAL_TABLET | Freq: Once | ORAL | Status: AC
  Administered 2015-10-03: 650 mg via ORAL
  Filled 2015-10-03: qty 2

## 2015-10-03 NOTE — ED Notes (Signed)
Left flank pain that started today.  Pt tearful in triage.

## 2015-10-04 ENCOUNTER — Emergency Department (HOSPITAL_BASED_OUTPATIENT_CLINIC_OR_DEPARTMENT_OTHER): Payer: Self-pay

## 2015-10-04 ENCOUNTER — Emergency Department (HOSPITAL_BASED_OUTPATIENT_CLINIC_OR_DEPARTMENT_OTHER)
Admission: EM | Admit: 2015-10-04 | Discharge: 2015-10-04 | Disposition: A | Discharge status: Home / Self Care | Payer: Self-pay | Attending: Emergency Medicine | Admitting: Emergency Medicine

## 2015-10-04 DIAGNOSIS — N12 Tubulo-interstitial nephritis, not specified as acute or chronic: Secondary | ICD-10-CM

## 2015-10-04 LAB — CBC WITH DIFFERENTIAL/PLATELET
Basophils Absolute: 0 10*3/uL (ref 0.0–0.1)
Basophils Relative: 0 %
Eosinophils Absolute: 0.2 10*3/uL (ref 0.0–0.7)
Eosinophils Relative: 2 %
HCT: 38.9 % (ref 36.0–46.0)
Hemoglobin: 13.7 g/dL (ref 12.0–15.0)
Lymphocytes Relative: 28 %
Lymphs Abs: 3 10*3/uL (ref 0.7–4.0)
MCH: 29.7 pg (ref 26.0–34.0)
MCHC: 35.2 g/dL (ref 30.0–36.0)
MCV: 84.2 fL (ref 78.0–100.0)
Monocytes Absolute: 0.5 10*3/uL (ref 0.1–1.0)
Monocytes Relative: 5 %
Neutro Abs: 7.1 10*3/uL (ref 1.7–7.7)
Neutrophils Relative %: 65 %
Platelets: 234 10*3/uL (ref 150–400)
RBC: 4.62 MIL/uL (ref 3.87–5.11)
RDW: 13.3 % (ref 11.5–15.5)
WBC: 10.8 10*3/uL — ABNORMAL HIGH (ref 4.0–10.5)

## 2015-10-04 LAB — BASIC METABOLIC PANEL
Anion gap: 9 (ref 5–15)
BUN: 5 mg/dL — ABNORMAL LOW (ref 6–20)
CALCIUM: 9.1 mg/dL (ref 8.9–10.3)
CO2: 24 mmol/L (ref 22–32)
CREATININE: 0.75 mg/dL (ref 0.44–1.00)
Chloride: 106 mmol/L (ref 101–111)
GFR calc non Af Amer: >60 mL/min (ref >60)
Glucose, Bld: 108 mg/dL — ABNORMAL HIGH (ref 65–99)
Potassium: 3.2 mmol/L — ABNORMAL LOW (ref 3.5–5.1)
SODIUM: 139 mmol/L (ref 135–145)

## 2015-10-04 MED ORDER — FENTANYL CITRATE (PF) 100 MCG/2ML IJ SOLN
50.0000 ug | INTRAMUSCULAR | Status: AC | PRN
  Administered 2015-10-04 (×2): 50 ug via INTRAVENOUS
  Filled 2015-10-04 (×2): qty 2

## 2015-10-04 MED ORDER — CIPROFLOXACIN HCL 500 MG PO TABS: 500.0000 mg | ORAL_TABLET | Freq: Two times a day (BID) | ORAL | Status: DC

## 2015-10-04 MED ORDER — FENTANYL CITRATE (PF) 100 MCG/2ML IJ SOLN
100.0000 ug | Freq: Once | INTRAMUSCULAR | Status: AC
  Administered 2015-10-04: 100 ug via INTRAVENOUS
  Filled 2015-10-04: qty 2

## 2015-10-04 MED ORDER — PROMETHAZINE HCL 25 MG PO TABS: 25.0000 mg | ORAL_TABLET | Freq: Four times a day (QID) | ORAL | Status: DC | PRN

## 2015-10-04 MED ORDER — DEXTROSE 5 % IV SOLN
1.0000 g | Freq: Once | INTRAVENOUS | Status: AC
  Administered 2015-10-04: 1 g via INTRAVENOUS
  Filled 2015-10-04: qty 10

## 2015-10-04 NOTE — Discharge Instructions (Signed)

## 2015-10-04 NOTE — ED Provider Notes (Signed)
CSN: [id number]     Arrival date & time 10/03/15  2149 History   First MD Initiated Contact with Patient 10/04/15 [phone]     Chief Complaint  Patient presents with  . Flank Pain     (Consider location/radiation/quality/duration/timing/severity/associated sxs/prior Treatment) HPI  This is a 30 year old female with left flank pain that started yesterday. The onset was gradual but it has become severe. It is minimally worsened with movement. There is been associated nausea but no vomiting. She has had no dysuria or hematuria. She does not know she's had a fever but was afebrile on arrival. She compares the pain to that of labor.  Past Medical History  Diagnosis Date  . TMJ (temporomandibular joint syndrome)    History reviewed. No pertinent past surgical history. Family History  Problem Relation Age of Onset  . Hyperlipidemia Father   . Hypertension Father   . Diabetes Neg Hx   . Heart attack Neg Hx   . Sudden death Neg Hx    Social History  Substance Use Topics  . Smoking status: Current Every Day Smoker  . Smokeless tobacco: None  . Alcohol Use: No   OB History    No data available     Review of Systems  All other systems reviewed and are negative.   Allergies  Review of patient's allergies indicates no known allergies.  Home Medications   Prior to Admission medications   Not on File   BP 147/101 mmHg  Pulse 79  Temp(Src) 97.9 F (36.6 C) (Oral)  Resp 18  Ht  (1.626 m)  Wt 190 lb (86.183 kg)  BMI 32.60 kg/m2  SpO2 100%  LMP 09/12/2015   Physical Exam  General: Well-developed, well-nourished female in no acute distress; appearance consistent with age of record HENT: normocephalic; atraumatic Eyes: pupils equal, round and reactive to light; extraocular muscles intact Neck: supple Heart: regular rate and rhythm Lungs: clear to auscultation bilaterally Abdomen: soft; nondistended; nontender; no masses or hepatosplenomegaly; bowel sounds present GU:  Mild left CVA tenderness Extremities: No deformity; full range of motion; pulses normal Neurologic: Awake, alert and oriented; motor function intact in all extremities and symmetric; no facial droop Skin: Warm and dry Psychiatric: Flat affect    ED Course  Procedures (including critical care time)   MDM   Nursing notes and vitals signs, including pulse oximetry, reviewed.  Summary of this visit's results, reviewed by myself:  Labs:  Results for orders placed or performed during the hospital encounter of 10/04/15 (from the past 24 hour(s))  Urinalysis, Routine w reflex microscopic (not at [facility])     Status: Abnormal   Collection Time: 10/03/15 10:05 PM  Result Value Ref Range   Color, Urine YELLOW YELLOW   APPearance CLOUDY (A) CLEAR   Specific Gravity, Urine 1.009 1.005 - 1.030   pH 6.0 5.0 - 8.0   Glucose, UA NEGATIVE NEGATIVE mg/dL   Hgb urine dipstick LARGE (A) NEGATIVE   Bilirubin Urine NEGATIVE NEGATIVE   Ketones, ur NEGATIVE NEGATIVE mg/dL   Protein, ur 272 (A) NEGATIVE mg/dL   Nitrite NEGATIVE NEGATIVE   Leukocytes, UA SMALL (A) NEGATIVE  Pregnancy, urine     Status: None   Collection Time: 10/03/15 10:05 PM  Result Value Ref Range   Preg Test, Ur NEGATIVE NEGATIVE  Urine microscopic-add on     Status: Abnormal   Collection Time: 10/03/15 10:05 PM  Result Value Ref Range   Squamous Epithelial / LPF 6-30 (A) NONE SEEN  WBC, UA 6-30 0 - 5 WBC/hpf   RBC / HPF 6-30 0 - 5 RBC/hpf   Bacteria, UA FEW (A) NONE SEEN  Basic metabolic panel     Status: Abnormal   Collection Time: 10/04/15  1:50 AM  Result Value Ref Range   Sodium 139 135 - 145 mmol/L   Potassium 3.2 (L) 3.5 - 5.1 mmol/L   Chloride 106 101 - 111 mmol/L   CO2 24 22 - 32 mmol/L   Glucose, Bld 108 (H) 65 - 99 mg/dL   BUN 5 (L) 6 - 20 mg/dL   Creatinine, Ser 1.610.75 0.44 - 1.00 mg/dL   Calcium 9.1 8.9 - 09.610.3 mg/dL   GFR calc non Af Amer >60 >60 mL/min   GFR calc Af Amer >60 >60 mL/min   Anion gap 9 5 -  15  CBC with Differential     Status: Abnormal   Collection Time: 10/04/15  1:50 AM  Result Value Ref Range   WBC 10.8 (H) 4.0 - 10.5 K/uL   RBC 4.62 3.87 - 5.11 MIL/uL   Hemoglobin 13.7 12.0 - 15.0 g/dL   HCT 04.538.9 40.936.0 - 81.146.0 %   MCV 84.2 78.0 - 100.0 fL   MCH 29.7 26.0 - 34.0 pg   MCHC 35.2 30.0 - 36.0 g/dL   RDW 91.413.3 78.211.5 - 95.615.5 %   Platelets 234 150 - 400 K/uL   Neutrophils Relative % 65 %   Neutro Abs 7.1 1.7 - 7.7 K/uL   Lymphocytes Relative 28 %   Lymphs Abs 3.0 0.7 - 4.0 K/uL   Monocytes Relative 5 %   Monocytes Absolute 0.5 0.1 - 1.0 K/uL   Eosinophils Relative 2 %   Eosinophils Absolute 0.2 0.0 - 0.7 K/uL   Basophils Relative 0 %   Basophils Absolute 0.0 0.0 - 0.1 K/uL    Imaging Studies: Ct Renal Stone Study  10/04/2015  CLINICAL DATA:  Left flank pain, hematuria.  Question renal stone. EXAM: CT ABDOMEN AND PELVIS WITHOUT CONTRAST TECHNIQUE: Multidetector CT imaging of the abdomen and pelvis was performed following the standard protocol without IV contrast. COMPARISON:  None. FINDINGS: Lower chest: The included lung bases are clear. No pleural effusion. Liver: No focal lesion allowing for lack contrast. Hepatobiliary: Gallbladder physiologically distended, no calcified stone. No biliary dilatation. Pancreas: No ductal dilatation or inflammation. Spleen: Normal. Adrenal glands: No nodule. Kidneys: Prominence of the left renal pelvis and proximal ureter, no left urolithiasis. Mild perinephric edema at the renal hilum. No right hydronephrosis or urolithiasis. Stomach/Bowel: Stomach is decompressed. There are no dilated or thickened small bowel loops. Small volume of stool throughout the colon without colonic wall thickening. The appendix is normal. Vascular/Lymphatic: No retroperitoneal adenopathy. Abdominal aorta is normal in caliber. Reproductive: Uterus is retroverted. Ovaries symmetric in size. No adnexal mass. Bladder: Physiologically distended, no wall thickening. Other: No  free air, free fluid, or intra-abdominal fluid collection. Tiny fat containing umbilical hernia. Musculoskeletal: There are no acute or suspicious osseous abnormalities. IMPRESSION: No urolithiasis. Prominence of the left renal pelvis and proximal ureter with perinephric edema at the renal hilum, can be seen with pyelonephritis. For recently passed stone could have this appearance, however no stone is seen in the bladder. Electronically Signed   By: Rubye OaksMelanie  Ehinger M.D.   On: 10/04/2015 04:35        Paula LibraJohn Mahoganie Basher, MD 10/04/15 [phone]

## 2015-10-05 LAB — URINE CULTURE

## 2016-09-30 ENCOUNTER — Emergency Department (HOSPITAL_BASED_OUTPATIENT_CLINIC_OR_DEPARTMENT_OTHER)
Admission: EM | Admit: 2016-09-30 | Discharge: 2016-09-30 | Disposition: A | Discharge status: Home / Self Care | Payer: Self-pay | Attending: Emergency Medicine | Admitting: Emergency Medicine

## 2016-09-30 ENCOUNTER — Encounter (HOSPITAL_BASED_OUTPATIENT_CLINIC_OR_DEPARTMENT_OTHER): Payer: Self-pay | Admitting: *Deleted

## 2016-09-30 DIAGNOSIS — F17200 Nicotine dependence, unspecified, uncomplicated: Secondary | ICD-10-CM | POA: Insufficient documentation

## 2016-09-30 DIAGNOSIS — R222 Localized swelling, mass and lump, trunk: Secondary | ICD-10-CM | POA: Insufficient documentation

## 2016-09-30 DIAGNOSIS — L723 Sebaceous cyst: Secondary | ICD-10-CM | POA: Insufficient documentation

## 2016-09-30 MED ORDER — TRAMADOL HCL 50 MG PO TABS
50.0000 mg | ORAL_TABLET | Freq: Four times a day (QID) | ORAL | 0 refills | Status: DC | PRN
Start: 2016-09-30 — End: 2017-01-22

## 2016-09-30 MED ORDER — SULFAMETHOXAZOLE-TRIMETHOPRIM 800-160 MG PO TABS: 1.0000 | ORAL_TABLET | Freq: Two times a day (BID) | ORAL | 0 refills | Status: AC

## 2016-09-30 MED ORDER — LIDOCAINE HCL (PF) 1 % IJ SOLN
5.0000 mL | Freq: Once | INTRAMUSCULAR | Status: AC
  Administered 2016-09-30: 5 mL
  Filled 2016-09-30: qty 5

## 2016-09-30 NOTE — ED Triage Notes (Signed)
Patient states she has a one week history of hard raised area under her left breast.  Describes the color as blue, painful and getting worse.

## 2016-09-30 NOTE — ED Provider Notes (Signed)
Emergency Department Provider Note   I have reviewed the triage vital signs and the nursing notes.   HISTORY  Chief Complaint Cyst (Left chest)   HPI Robin Farmer is a 31 y.o. female presents to the emergency department for evaluation of painful mass to the left chest. The area erupted one week ago and has been very painful. Minimal redness. No drainage. No fevers or chills. No difficulty breathing. No radiation of symptoms. No modifying factors.   Past Medical History:  Diagnosis Date  . TMJ (temporomandibular joint syndrome)     Patient Active Problem List   Diagnosis Date Noted  . Right knee injury 10/14/2012    History reviewed. No pertinent surgical history.  Current Outpatient Rx  . Order #: [id number] Class: Print  . Order #: [id number] Class: Print  . Order #: [id number] Class: Print  . Order #: [id number] Class: Print    Allergies Patient has no known allergies.  Family History  Problem Relation Age of Onset  . Hyperlipidemia Father   . Hypertension Father   . Diabetes Neg Hx   . Heart attack Neg Hx   . Sudden death Neg Hx     Social History Social History  Substance Use Topics  . Smoking status: Current Every Day Smoker  . Smokeless tobacco: Never Used  . Alcohol use No    Review of Systems  Constitutional: No fever/chills Eyes: No visual changes. ENT: No sore throat. Cardiovascular: Denies chest pain. Respiratory: Denies shortness of breath. Gastrointestinal: No abdominal pain.  No nausea, no vomiting.  No diarrhea.  No constipation. Genitourinary: Negative for dysuria. Musculoskeletal: Negative for back pain. Skin: Negative for rash. Painful nodule under left breast.  Neurological: Negative for headaches, focal weakness or numbness.  10-point ROS otherwise negative.  ____________________________________________   PHYSICAL EXAM:  VITAL SIGNS: ED Triage Vitals  Enc Vitals Group     BP 09/30/16 1055 134/84     Pulse Rate 09/30/16  1055 78     Resp 09/30/16 1055 18     Temp 09/30/16 1055 99.1 F (37.3 C)     Temp Source 09/30/16 1055 Oral     SpO2 09/30/16 1055 100 %     Weight 09/30/16 1055 185 lb (83.9 kg)     Height 09/30/16 1055 5\' 4"  (1.626 m)     Pain Score 09/30/16 1106 8   Constitutional: Alert and oriented. Well appearing and in no acute distress. Eyes: Conjunctivae are normal.  Head: Atraumatic. Nose: No congestion/rhinnorhea. Mouth/Throat: Mucous membranes are moist.  Oropharynx non-erythematous. Neck: No stridor.  Cardiovascular: Normal rate, regular rhythm. Good peripheral circulation. Grossly normal heart sounds.   Respiratory: Normal respiratory effort.  No retractions. Lungs CTAB. Gastrointestinal: Soft and nontender. No distention.  Musculoskeletal: No lower extremity tenderness nor edema. No gross deformities of extremities. Neurologic:  Normal speech and language. No gross focal neurologic deficits are appreciated.  Skin:  Skin is warm, dry and intact. No rash noted. 1 x 1 cm nodule appreciated under the left breast. No overlying cellulitis, induration, or fluctuance.   ____________________________________________   PROCEDURES  Procedure(s) performed:   Marland KitchenMarland KitchenIncision and Drainage Date/Time: 09/30/2016 12:14 PM Performed by: Olga Seyler G Authorized by: Maia Plan   Consent:    Consent obtained:  Verbal   Consent given by:  Patient   Risks discussed:  Bleeding, incomplete drainage, pain, damage to other organs and infection   Alternatives discussed:  No treatment Location:    Type:  Abscess   Size:  2 x 2    Location:  Trunk   Trunk location:  L breast Pre-procedure details:    Skin preparation:  Chloraprep Anesthesia (see MAR for exact dosages):    Anesthesia method:  Local infiltration   Local anesthetic:  Lidocaine 1% w/o epi Procedure type:    Complexity:  Simple Procedure details:    Needle aspiration: yes     Needle size:  18 G   Incision types:  Stab incision    Incision depth:  Subcutaneous   Scalpel blade:  11   Wound management:  Probed and deloculated   Drainage:  Bloody and purulent   Drainage amount:  Scant   Wound treatment:  Wound left open   Packing materials:  None Post-procedure details:    Patient tolerance of procedure:  Tolerated well, no immediate complications    EMERGENCY DEPARTMENT US SOFT TISSUE INTERPRETATION "Study: Limited Soft Tissue Ultrasound"  INDICATIONS: Pain Multiple views of the body part were obtained in real-time with a multi-frequency linear probe PERFORMED BY:  Myself SIDE:Left BODY PART:Chest wall FINDINGS: Other : small subcutaneous fluid collection.  INTERPRETATION:  Abcess present   CPT:  Chest wall [id number] ____________________________________________   INITIAL IMPRESSION / ASSESSMENT AND PLAN / ED COURSE  Pertinent labs & imaging results that were available during my care of the patient were reviewed by me and considered in my medical decision making (see chart for details).  Patient presents to the emergency department for evaluation of left breast mass. The area was appreciated on ultrasound to be a small fluid collection: Abscess versus cyst. Plan for incision and drainage with Bactrim for home.   Drainage as above. Scant purulent discharge. Suspect cyst vs abscess. Discussed PCP f/u, wound care, and possible need for referral to derm/gen surg for cyst capsule removal.   At this time, I do not feel there is any life-threatening condition present. I have reviewed and discussed all results (EKG, imaging, lab, urine as appropriate), exam findings with patient. I have reviewed nursing notes and appropriate previous records.  I feel the patient is safe to be discharged home without further emergent workup. Discussed usual and customary return precautions. Patient and family (if present) verbalize understanding and are comfortable with this plan.  Patient will follow-up with their primary care  provider. If they do not have a primary care provider, information for follow-up has been provided to them. All questions have been answered.  ____________________________________________  FINAL CLINICAL IMPRESSION(S) / ED DIAGNOSES  Final diagnoses:  Chest wall mass     MEDICATIONS GIVEN DURING THIS VISIT:  Medications  lidocaine (PF) (XYLOCAINE) 1 % injection 5 mL (5 mLs Infiltration Given by Other 09/30/16 1151)     NEW OUTPATIENT MEDICATIONS STARTED DURING THIS VISIT:  Discharge Medication List as of 09/30/2016 12:17 PM    START taking these medications   Details  sulfamethoxazole-trimethoprim (BACTRIM DS,SEPTRA DS) 800-160 MG tablet Take 1 tablet by mouth 2 (two) times daily., Starting Sun 09/30/2016, Until Sun 10/07/2016, Print    traMADol (ULTRAM) 50 MG tablet Take 1 tablet (50 mg total) by mouth every 6 (six) hours as needed., Starting Sun 09/30/2016, Print         Note:  This document was prepared using Dragon voice recognition software and may include unintentional dictation errors.  Alona BeneJoshua Alyn Jurney, MD Emergency Medicine   Baelyn Doring, Arlyss RepressJoshua G, MD 10/01/16 [phone]

## 2016-09-30 NOTE — Discharge Instructions (Signed)
You have been seen in the Emergency Department (ED) today for an abscess.  This was drained in the ED. ° °Please follow up with your doctor or in the ED in 24-48 hours for recheck of your wound.  Read through the additional discharge instructions included below regarding wound care recommendations.  Keep the wound clean and dry, though you may wash as you would normally.  Change the dressing twice daily. ° °Call your doctor sooner or return to the ED if you develop worsening signs of infection such as: increased redness, increased pain, pus, or fever. ° ° °Abscess °An abscess is an infected area that contains a collection of pus and debris. It can occur in almost any part of the body. An abscess is also known as a furuncle or boil. °CAUSES  °An abscess occurs when tissue gets infected. This can occur from blockage of oil or sweat glands, infection of hair follicles, or a minor injury to the skin. As the body tries to fight the infection, pus collects in the area and creates pressure under the skin. This pressure causes pain. People with weakened immune systems have difficulty fighting infections and get certain abscesses more often.  °SYMPTOMS °Usually an abscess develops on the skin and becomes a painful mass that is red, warm, and tender. If the abscess forms under the skin, you may feel a moveable soft area under the skin. Some abscesses break open (rupture) on their own, but most will continue to get worse without care. The infection can spread deeper into the body and eventually into the bloodstream, causing you to feel ill.  °DIAGNOSIS  °Your caregiver will take your medical history and perform a physical exam. A sample of fluid may also be taken from the abscess to determine what is causing your infection. °TREATMENT  °Your caregiver may prescribe antibiotic medicines to fight the infection. However, taking antibiotics alone usually does not cure an abscess. Your caregiver may need to make a small cut  (incision) in the abscess to drain the pus. In some cases, gauze is packed into the abscess to reduce pain and to continue draining the area. °HOME CARE INSTRUCTIONS  °Only take over-the-counter or prescription medicines for pain, discomfort, or fever as directed by your caregiver. °If you were prescribed antibiotics, take them as directed. Finish them even if you start to feel better. °If gauze is used, follow your caregiver's directions for changing the gauze. °To avoid spreading the infection: °Keep your draining abscess covered with a bandage. °Wash your hands well. °Do not share personal care items, towels, or whirlpools with others. °Avoid skin contact with others. °Keep your skin and clothes clean around the abscess. °Keep all follow-up appointments as directed by your caregiver. °SEEK MEDICAL CARE IF:  °You have increased pain, swelling, redness, fluid drainage, or bleeding. °You have muscle aches, chills, or a general ill feeling. °You have a fever. °MAKE SURE YOU:  °Understand these instructions. °Will watch your condition. °Will get help right away if you are not doing well or get worse. °Document Released: 02/07/2005 Document Revised: 10/30/2011 Document Reviewed: 07/13/2011 °ExitCare® Patient Information ©2015 [employer]. This information is not intended to replace advice given to you by your health care provider. Make sure you discuss any questions you have with your health care provider. ° °Abscess °Care After °An abscess (also called a boil or furuncle) is an infected area that contains a collection of pus. Signs and symptoms of an abscess include pain, tenderness, redness, or hardness,   or you may feel a moveable soft area under your skin. An abscess can occur anywhere in the body. The infection may spread to surrounding tissues causing cellulitis. A cut (incision) by the surgeon was made over your abscess and the pus was drained out. Gauze may have been packed into the space to provide a drain  that will allow the cavity to heal from the inside outwards. The boil may be painful for 5 to 7 days. Most people with a boil do not have high fevers. Your abscess, if seen early, may not have localized, and may not have been lanced. If not, another appointment may be required for this if it does not get better on its own or with medications. °HOME CARE INSTRUCTIONS  °Only take over-the-counter or prescription medicines for pain, discomfort, or fever as directed by your caregiver. °When you bathe, soak and then remove gauze or iodoform packs at least daily or as directed by your caregiver. You may then wash the wound gently with mild soapy water. Repack with gauze or do as your caregiver directs. °SEEK IMMEDIATE MEDICAL CARE IF:  °You develop increased pain, swelling, redness, drainage, or bleeding in the wound site. °You develop signs of generalized infection including muscle aches, chills, fever, or a general ill feeling. °An oral temperature above 102° F (38.9° C) develops, not controlled by medication. °See your caregiver for a recheck if you develop any of the symptoms described above. If medications (antibiotics) were prescribed, take them as directed. °Document Released: 11/16/2004 Document Revised: 07/23/2011 Document Reviewed: 07/14/2007 °Exit[employer]® Patient Information ©2015 [employer]. This information is not intended to replace advice given to you by your health care provider. Make sure you discuss any questions you have with your health care provider. ° °Cellulitis °Cellulitis is an infection of the skin and the tissue beneath it. The infected area is usually red and tender. Cellulitis occurs most often in the arms and lower legs.  °CAUSES  °Cellulitis is caused by bacteria that enter the skin through cracks or cuts in the skin. The most common types of bacteria that cause cellulitis are staphylococci and streptococci. °SIGNS AND SYMPTOMS  °Redness and warmth. °Swelling. °Tenderness or  pain. °Fever. °DIAGNOSIS  °Your health care provider can usually determine what is wrong based on a physical exam. Blood tests may also be done. °TREATMENT  °Treatment usually involves taking an antibiotic medicine. °HOME CARE INSTRUCTIONS  °Take your antibiotic medicine as directed by your health care provider. Finish the antibiotic even if you start to feel better. °Keep the infected arm or leg elevated to reduce swelling. °Apply a warm cloth to the affected area up to 4 times per day to relieve pain. °Take medicines only as directed by your health care provider. °Keep all follow-up visits as directed by your health care provider. °SEEK MEDICAL CARE IF:  °You notice red streaks coming from the infected area. °Your red area gets larger or turns dark in color. °Your bone or joint underneath the infected area becomes painful after the skin has healed. °Your infection returns in the same area or another area. °You notice a swollen bump in the infected area. °You develop new symptoms. °You have a fever. °SEEK IMMEDIATE MEDICAL CARE IF:  °You feel very sleepy. °You develop vomiting or diarrhea. °You have a general ill feeling (malaise) with muscle aches and pains. °MAKE SURE YOU:  °Understand these instructions. °Will watch your condition. °Will get help right away if you are not doing well or get   worse. °Document Released: 02/07/2005 Document Revised: 09/14/2013 Document Reviewed: 07/16/2011 °ExitCare® Patient Information ©2015 [employer]. This information is not intended to replace advice given to you by your health care provider. Make sure you discuss any questions you have with your health care provider. ° ° ° °

## 2017-01-22 ENCOUNTER — Emergency Department (HOSPITAL_BASED_OUTPATIENT_CLINIC_OR_DEPARTMENT_OTHER): Payer: Self-pay

## 2017-01-22 ENCOUNTER — Encounter (HOSPITAL_BASED_OUTPATIENT_CLINIC_OR_DEPARTMENT_OTHER): Payer: Self-pay | Admitting: Emergency Medicine

## 2017-01-22 ENCOUNTER — Emergency Department (HOSPITAL_BASED_OUTPATIENT_CLINIC_OR_DEPARTMENT_OTHER)
Admission: EM | Admit: 2017-01-22 | Discharge: 2017-01-22 | Disposition: A | Discharge status: Home / Self Care | Payer: Self-pay | Attending: Emergency Medicine | Admitting: Emergency Medicine

## 2017-01-22 DIAGNOSIS — F17200 Nicotine dependence, unspecified, uncomplicated: Secondary | ICD-10-CM | POA: Insufficient documentation

## 2017-01-22 DIAGNOSIS — M25511 Pain in right shoulder: Secondary | ICD-10-CM

## 2017-01-22 MED ORDER — MELOXICAM 15 MG PO TABS: 15.0000 mg | ORAL_TABLET | Freq: Every day | ORAL | 0 refills | Status: DC

## 2017-01-22 MED ORDER — OXYCODONE-ACETAMINOPHEN 5-325 MG PO TABS
1.0000 | ORAL_TABLET | ORAL | Status: DC | PRN
  Administered 2017-01-22: 1 via ORAL
  Filled 2017-01-22 (×2): qty 1

## 2017-01-22 NOTE — ED Triage Notes (Signed)
Pt states she felt something pop in her right shoulder.  Someone helped her with her shoulder, she felt like it had come out of joint.  Pt c/o severe pain.  No abnormalities noted.

## 2017-01-22 NOTE — ED Provider Notes (Signed)
[facility] Provider Note   CSN: [id number] Arrival date & time: 01/22/17  0954     History   Chief Complaint Chief Complaint  Patient presents with  . Shoulder Pain    HPI Robin Farmer is a 31 y.o. female who presents emergency Department with chief complaint of right shoulder pain. Patient states that she was at a gasoline when she reached with her arm and felt it dislocate. She said she was in immediate severe pain in one of the gentleman at the gas station helped push her shoulder back in. She states that she's had severe pain in the shoulder since that time. She has had one previous episode of dislocation about 10 years ago. She denies any numbness, tingling. The patient got 2 Percocet in triage and states that her pain is still severe.  HPI  Past Medical History:  Diagnosis Date  . TMJ (temporomandibular joint syndrome)     Patient Active Problem List   Diagnosis Date Noted  . Right knee injury 10/14/2012    No past surgical history on file.  OB History    No data available       Home Medications    Prior to Admission medications   Not on File    Family History Family History  Problem Relation Age of Onset  . Hyperlipidemia Father   . Hypertension Father   . Diabetes Neg Hx   . Heart attack Neg Hx   . Sudden death Neg Hx     Social History Social History  Substance Use Topics  . Smoking status: Current Every Day Smoker  . Smokeless tobacco: Never Used  . Alcohol use No     Allergies   Patient has no known allergies.   Review of Systems Review of Systems Ten systems reviewed and are negative for acute change, except as noted in the HPI.    Physical Exam Updated Vital Signs BP 140/85 (BP Location: Left Arm)   Pulse 78   Temp 97.9 F (36.6 C) (Oral)   Resp (!) 22   Ht  (1.626 m)   Wt 86.2 kg (190 lb)   LMP 01/08/2017   SpO2 99%   BMI 32.61 kg/m   Physical Exam  Constitutional: She is oriented to person,  place, and time. She appears well-developed and well-nourished. No distress.  HENT:  Head: Normocephalic and atraumatic.  Eyes: Conjunctivae are normal. No scleral icterus.  Neck: Normal range of motion.  Cardiovascular: Normal rate, regular rhythm and normal heart sounds.  Exam reveals no gallop and no friction rub.   No murmur heard. Pulmonary/Chest: Effort normal and breath sounds normal. No respiratory distress.  Abdominal: Soft. Bowel sounds are normal. She exhibits no distension and no mass. There is no tenderness. There is no guarding.  Musculoskeletal:  Right shoulder tender to palpation along the short head of the bicep, anterior deltoid, no swelling or deformities noted. Patient declines range of motion secondary to pain. She has normal pulse and strong grip strength.  Neurological: She is alert and oriented to person, place, and time.  Skin: Skin is warm and dry. She is not diaphoretic.  Psychiatric: Her behavior is normal.  Nursing note and vitals reviewed.    ED Treatments / Results  Labs (all labs ordered are listed, but only abnormal results are displayed) Labs Reviewed - No data to display  EKG  EKG Interpretation None       Radiology Dg Shoulder Right  Result Date:  01/22/2017 CLINICAL DATA:  Right shoulder pain, possible transient dislocation. EXAM: RIGHT SHOULDER - 2+ VIEW COMPARISON:  Bilateral shoulder x-rays dated Sep 16, 2006. FINDINGS: There is no evidence of fracture or dislocation. There is no evidence of arthropathy or other focal bone abnormality. Soft tissues are unremarkable. IMPRESSION: Negative. Electronically Signed   By: Obie DredgeWilliam T Derry M.D.   On: 01/22/2017 10:32    Procedures Procedures (including critical care time)  Medications Ordered in ED Medications  oxyCODONE-acetaminophen (PERCOCET/ROXICET) 5-325 MG per tablet 1 tablet (1 tablet Oral Given 01/22/17 1029)     Initial Impression / Assessment and Plan / ED Course  I have reviewed the  triage vital signs and the nursing notes.  Pertinent labs & imaging results that were available during my care of the patient were reviewed by me and considered in my medical decision making (see chart for details).     Patient x-ray negative for any acute fractures or dislocations. Patient is currently taking Subutex I have [place] up [place] the [place] controlled substances [place]. Patient will be discharged with meloxicam, ice and follow-up with orthopedics. She is placed in a sling. Advised patient to begin pendulums with her shoulder starting tomorrow. She appears safe for discharge at this time  Final Clinical Impressions(s) / ED Diagnoses   Final diagnoses:  Acute pain of right shoulder    New Prescriptions New Prescriptions   No medications on file     Arthor CaptainHarris, Jaythen Hamme, PA-C 01/22/17 1158    Gwyneth SproutPlunkett, Whitney, MD 01/22/17 1524

## 2017-01-22 NOTE — ED Notes (Signed)
PA notified. Order for sling.

## 2017-01-22 NOTE — ED Notes (Signed)
Pt verbalized understanding of discharge instructions and denies any further questions at this time.   

## 2017-01-22 NOTE — Discharge Instructions (Signed)
Because you are taking subutex I am unable to provide opiates. Please use the meloxicam for pain relief. He may also purchase over-the-counter lidocaine patches to place on your shoulder. Apply ice. Follow-up with the orthopedic physician if you're not improving. Make sure to remove your shoulder from the sling starting tomorrow and to do gentle range of motion exercises such as pendulums. Do not take any over-the-counter anti-inflammatory medications such as naproxen, ibuprofen or aspirin. He may take Tylenol.  Get help right away if: Your arm, hand, or fingers: Tingle. Become numb. Become swollen. Become painful. Turn white or blue.

## 2019-10-28 ENCOUNTER — Other Ambulatory Visit: Payer: Self-pay

## 2019-10-28 ENCOUNTER — Emergency Department (HOSPITAL_BASED_OUTPATIENT_CLINIC_OR_DEPARTMENT_OTHER)
Admission: EM | Admit: 2019-10-28 | Discharge: 2019-10-28 | Disposition: A | Discharge status: Home / Self Care | Payer: Medicaid Other | Attending: Emergency Medicine | Admitting: Emergency Medicine

## 2019-10-28 ENCOUNTER — Encounter (HOSPITAL_BASED_OUTPATIENT_CLINIC_OR_DEPARTMENT_OTHER): Payer: Self-pay | Admitting: *Deleted

## 2019-10-28 ENCOUNTER — Emergency Department (HOSPITAL_BASED_OUTPATIENT_CLINIC_OR_DEPARTMENT_OTHER): Payer: Medicaid Other

## 2019-10-28 DIAGNOSIS — F17200 Nicotine dependence, unspecified, uncomplicated: Secondary | ICD-10-CM | POA: Diagnosis not present

## 2019-10-28 DIAGNOSIS — M79672 Pain in left foot: Secondary | ICD-10-CM | POA: Diagnosis not present

## 2019-10-28 NOTE — ED Provider Notes (Signed)
[facility] EMERGENCY DEPARTMENT Provider Note   CSN: [id number] Arrival date & time: 10/28/19  1746     History Chief Complaint  Patient presents with  . Foot Pain    Robin Farmer is a 34 y.o. female   Patient presents today for left heel pain x2 weeks, no known injury.  Patient reports that she is on her feet a lot caring for her-year-old children.  She describes a sharp left heel pain constant occasionally radiating up her leg worsened with ambulation improved with rest and elevation.  Denies fall/injury, fever/chills, numbness/tingling, weakness, color change/wound or any additional concerns.    HPI     Past Medical History:  Diagnosis Date  . TMJ (temporomandibular joint syndrome)     Patient Active Problem List   Diagnosis Date Noted  . Right knee injury 10/14/2012    History reviewed. No pertinent surgical history.   OB History   No obstetric history on file.     Family History  Problem Relation Age of Onset  . Hyperlipidemia Father   . Hypertension Father   . Diabetes Neg Hx   . Heart attack Neg Hx   . Sudden death Neg Hx     Social History   Tobacco Use  . Smoking status: Current Every Day Smoker  . Smokeless tobacco: Never Used  Substance Use Topics  . Alcohol use: No  . Drug use: No    Home Medications Prior to Admission medications   Medication Sig Start Date End Date Taking? Authorizing Provider  Buprenorphine HCl-Naloxone HCl 8-2 MG FILM Take by mouth.    [provider]  meloxicam (MOBIC) 15 MG tablet Take 1 tablet (15 mg total) by mouth daily. 01/22/17   Arthor Captain, PA-C    Allergies    Patient has no known allergies.  Review of Systems   Review of Systems  Constitutional: Negative.  Negative for chills and fever.  Musculoskeletal: Positive for arthralgias.  Skin: Negative.  Negative for color change and wound.  Neurological: Negative.  Negative for weakness and numbness.    Physical  Exam Updated Vital Signs BP 122/90 (BP Location: Right Arm)   Pulse 77   Temp 97.6 F (36.4 C) (Oral)   Resp 16   Ht 5\' 4"  (1.626 m)   Wt 105.6 kg   LMP 09/30/2019   SpO2 100%   BMI 39.98 kg/m   Physical Exam Constitutional:      General: She is not in acute distress.    Appearance: Normal appearance. She is well-developed. She is not ill-appearing or diaphoretic.  HENT:     Head: Normocephalic and atraumatic.  Eyes:     General: Vision grossly intact. Gaze aligned appropriately.     Pupils: Pupils are equal, round, and reactive to light.  Neck:     Trachea: Trachea and phonation normal.  Cardiovascular:     Pulses:          Dorsalis pedis pulses are 2+ on the right side and 2+ on the left side.  Pulmonary:     Effort: Pulmonary effort is normal. No respiratory distress.  Abdominal:     General: There is no distension.     Palpations: Abdomen is soft.     Tenderness: There is no abdominal tenderness. There is no guarding or rebound.  Musculoskeletal:        General: Normal range of motion.     Cervical back: Normal range of motion.     Right  lower leg: No edema.     Left lower leg: No edema.       Feet:  Feet:     Right foot:     Protective Sensation: 5 sites tested. 5 sites sensed.     Skin integrity: Skin integrity normal.     Left foot:     Protective Sensation: 5 sites tested. 5 sites sensed.     Skin integrity: Skin integrity normal.     Comments: Point tenderness of the left heel no overlying skin change. No skin break. Skin:    General: Skin is warm and dry.  Neurological:     Mental Status: She is alert.     GCS: GCS eye subscore is 4. GCS verbal subscore is 5. GCS motor subscore is 6.     Comments: Speech is clear and goal oriented, follows commands Major Cranial nerves without deficit, no facial droop Normal strength in lower extremities bilaterally including dorsiflexion and plantar flexion Sensation normal to light and sharp touch Moves  extremities without ataxia, coordination intact  Psychiatric:        Behavior: Behavior normal.    ED Results / Procedures / Treatments   Labs (all labs ordered are listed, but only abnormal results are displayed) Labs Reviewed - No data to display  EKG None  Radiology DG Foot Complete Left  Result Date: 10/28/2019 CLINICAL DATA:  Pain and swelling EXAM: LEFT FOOT - COMPLETE 3+ VIEW COMPARISON:  04/11/2016 FINDINGS: There is no evidence of fracture or dislocation. There is no evidence of arthropathy or other focal bone abnormality. Soft tissues are unremarkable. Small plantar calcaneal spur. IMPRESSION: No acute osseous abnormality.  Small plantar calcaneal spur Electronically Signed   By: Donavan Foil M.D.   On: 10/28/2019 18:14    Procedures Procedures (including critical care time)  Medications Ordered in ED Medications - No data to display  ED Course  I have reviewed the triage vital signs and the nursing notes.  Pertinent labs & imaging results that were available during my care of the patient were reviewed by me and considered in my medical decision making (see chart for details).    MDM Rules/Calculators/A&P                          Additional History Obtained: 1. Nursing notes from this visit. - 34 year old female presents today for left heel pain for the last 2 weeks. She reports she is on her feet often taking care of her kids. She denies any injury to the area. On exam she is point tender to the heel no overlying skin change or skin break. There is no tenderness of the midfoot or toes, no tenderness about the ankles, shin or knee. She is equal pedal pulses bilaterally and sensation capillary refill is intact to all the toes. Achilles tendon is intact without pain. Strong equal dorsi/plantar flexion and appropriate motion of the knees without pain. X-ray of the left foot was obtained in triage reviewed below.  DG Left Foot:  IMPRESSION:  No acute osseous  abnormality. Small plantar calcaneal spur  I personally reviewed patient's left foot x-ray and agree with radiologist interpretation. Patient is point tender over the calcaneal spur suspect this may be the cause of her pain. - Suspect patient's pain may be secondary to calcaneal spur today there is no evidence of trauma. No evidence of cellulitis, septic arthritis, DVT, compartment syndrome, neurovascular compromise, gross ligamentous laxity or other emergent  pathologies at this time. Plan of care is to give patient postop shoe and crutches to keep weight off the heel and to encourage rice therapy. She'll be given referral to podiatry for follow-up appointment.  At this time there does not appear to be any evidence of an acute emergency medical condition and the patient appears stable for discharge with appropriate outpatient follow up. Diagnosis was discussed with patient who verbalizes understanding of care plan and is agreeable to discharge. I have discussed return precautions with patient who verbalizes understanding. Patient encouraged to follow-up with their PCP and podiatry. All questions answered.  Patient's case discussed with Dr. Rush Landmark who agrees with plan to discharge with follow-up.   Note: Portions of this report may have been transcribed using voice recognition software. Every effort was made to ensure accuracy; however, inadvertent computerized transcription errors may still be present. Final Clinical Impression(s) / ED Diagnoses Final diagnoses:  Pain of left heel    Rx / DC Orders ED Discharge Orders    None       Elizabeth Palau 10/28/19 2015    Tegeler, Canary Brim, MD 10/28/19 2352

## 2019-10-28 NOTE — Discharge Instructions (Addendum)
At this time there does not appear to be the presence of an emergent medical condition, however there is always the potential for conditions to change. Please read and follow the below instructions.  Please return to the Emergency Department immediately for any new or worsening symptoms. Please be sure to follow up with your Primary Care Provider within one week regarding your visit today; please call their office to schedule an appointment even if you are feeling better for a follow-up visit. You may call the foot specialist Dr. Logan Bores on your discharge paperwork for a follow-up visit. Use rest ice and elevation to help with pain. You may use the crutches over the next few days to help keep weight off of your left heel to help with inflammation.  Get help right away if: Your foot is numb or tingling. Your foot or toes are swollen. Your foot or toes turn white or blue. You have warmth and redness along your foot. You have any new/concerning or worsening of symptoms   Please read the additional information packets attached to your discharge summary.  Do not take your medicine if  develop an itchy rash, swelling in your mouth or lips, or difficulty breathing; call 911 and seek immediate emergency medical attention if this occurs.  Note: Portions of this text may have been transcribed using voice recognition software. Every effort was made to ensure accuracy; however, inadvertent computerized transcription errors may still be present.

## 2019-10-28 NOTE — ED Triage Notes (Addendum)
Left heel  pain x 2 weeks  Denies inj  Increased swelling

## 2019-11-04 ENCOUNTER — Emergency Department (HOSPITAL_BASED_OUTPATIENT_CLINIC_OR_DEPARTMENT_OTHER)
Admission: EM | Admit: 2019-11-04 | Discharge: 2019-11-05 | Disposition: A | Discharge status: Home / Self Care | Payer: Medicaid Other | Attending: Emergency Medicine | Admitting: Emergency Medicine

## 2019-11-04 ENCOUNTER — Encounter (HOSPITAL_BASED_OUTPATIENT_CLINIC_OR_DEPARTMENT_OTHER): Payer: Self-pay

## 2019-11-04 ENCOUNTER — Other Ambulatory Visit: Payer: Self-pay

## 2019-11-04 ENCOUNTER — Emergency Department (HOSPITAL_BASED_OUTPATIENT_CLINIC_OR_DEPARTMENT_OTHER): Payer: Medicaid Other

## 2019-11-04 DIAGNOSIS — F17210 Nicotine dependence, cigarettes, uncomplicated: Secondary | ICD-10-CM | POA: Insufficient documentation

## 2019-11-04 DIAGNOSIS — Y929 Unspecified place or not applicable: Secondary | ICD-10-CM | POA: Insufficient documentation

## 2019-11-04 DIAGNOSIS — Y999 Unspecified external cause status: Secondary | ICD-10-CM | POA: Diagnosis not present

## 2019-11-04 DIAGNOSIS — S6991XA Unspecified injury of right wrist, hand and finger(s), initial encounter: Secondary | ICD-10-CM | POA: Diagnosis present

## 2019-11-04 DIAGNOSIS — Y939 Activity, unspecified: Secondary | ICD-10-CM | POA: Diagnosis not present

## 2019-11-04 DIAGNOSIS — S60221A Contusion of right hand, initial encounter: Secondary | ICD-10-CM

## 2019-11-04 DIAGNOSIS — W2209XA Striking against other stationary object, initial encounter: Secondary | ICD-10-CM | POA: Diagnosis not present
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
## 2019-11-05 MED ORDER — NAPROXEN 250 MG PO TABS
500.0000 mg | ORAL_TABLET | Freq: Once | ORAL | Status: AC
  Administered 2019-11-05: 500 mg via ORAL
  Filled 2019-11-05: qty 2

## 2019-11-05 MED ORDER — NAPROXEN 375 MG PO TABS: ORAL_TABLET | ORAL | 0 refills | Status: AC

## 2019-11-05 NOTE — ED Provider Notes (Signed)
[place] Provider Note: Robin Spurling, MD, FACEP  CSN: [id number] MRN: [id number] ARRIVAL: 11/04/19 at 2028 ROOM: [place] [facility]   HISTORY OF PRESENT ILLNESS  11/05/19 12:06 AM Robin Farmer is a 34 y.o. female who punched a door about 2 to 3 hours prior to arrival.  She has bruising and pain to the right fourth and fifth fingers with associated swelling and decreased range of motion.  She rates associated pain is a 9 out of 10, worse with movement or palpation.  Although range of motion is decreased motor function is intact with intact sensation distally.   Past Medical History:  Diagnosis Date  . TMJ (temporomandibular joint syndrome)     History reviewed. No pertinent surgical history.  Family History  Problem Relation Age of Onset  . Hyperlipidemia Father   . Hypertension Father   . Diabetes Neg Hx   . Heart attack Neg Hx   . Sudden death Neg Hx     Social History   Tobacco Use  . Smoking status: Current Every Day Smoker    Types: Cigarettes  . Smokeless tobacco: Never Used  Vaping Use  . Vaping Use: Never used  Substance Use Topics  . Alcohol use: No  . Drug use: No    Prior to Admission medications   Medication Sig Start Date End Date Taking? Authorizing Provider  Buprenorphine HCl-Naloxone HCl 8-2 MG FILM Take by mouth.    [provider]  naproxen (NAPROSYN) 375 MG tablet Take 1 tablet twice daily as needed for pain. 11/05/19   Arali Somera, Jenny Reichmann, MD    Allergies Patient has no known allergies.   REVIEW OF SYSTEMS  Negative except as noted here or in the History of Present Illness.   PHYSICAL EXAMINATION  Initial Vital Signs Blood pressure 125/82, pulse 77, temperature 98.5 F (36.9 C), temperature source Oral, resp. rate 16, height 5\' 4"  (1.626 m), weight 103.9 kg, last menstrual period 10/11/2019, SpO2 96 %.  Examination General: Well-developed, well-nourished female in no acute distress;  appearance consistent with age of record HENT: normocephalic; atraumatic Eyes: Normal appearance Neck: supple Heart: regular rate and rhythm Lungs: Normal respiratory effort and excursion Abdomen: soft; nondistended Extremities: No deformity; ecchymosis, swelling and tenderness of right fourth and fifth fingers with decreased range of motion due to pain and swelling, fingers distally neurovascularly intact with intact tendon function:    Neurologic: Awake, alert and oriented; motor function intact in all extremities and symmetric; no facial droop Skin: Warm and dry Psychiatric: Normal mood and affect   RESULTS  Summary of this visit's results, reviewed and interpreted by myself:   EKG Interpretation  Date/Time:    Ventricular Rate:    PR Interval:    QRS Duration:   QT Interval:    QTC Calculation:   R Axis:     Text Interpretation:        Laboratory Studies: No results found for this or any previous visit (from the past 24 hour(s)). Imaging Studies: DG Hand Complete Right  Result Date: 11/04/2019 CLINICAL DATA:  34 year old female with trauma to the right hand. EXAM: RIGHT HAND - COMPLETE 3+ VIEW COMPARISON:  None. FINDINGS: There is no evidence of fracture or dislocation. There is no evidence of arthropathy or other focal bone abnormality. Soft tissues are unremarkable. IMPRESSION: Negative. Electronically Signed   By: Anner Crete M.D.   On: 11/04/2019 21:39    ED COURSE and MDM  Nursing notes, initial and subsequent vitals signs, including pulse oximetry, reviewed and interpreted by myself.  Vitals:   11/04/19 2046 11/04/19 2047  BP:  125/82  Pulse:  77  Resp:  16  Temp:  98.5 F (36.9 C)  TempSrc:  Oral  SpO2:  96%  Weight: 103.9 kg   Height: 5\' 4"  (1.626 m)    Medications  naproxen (NAPROSYN) tablet 500 mg (has no administration in time range)    Due to this patient's significant pain will place an ulnar gutter splint and have her follow-up with  hand surgery in a week if there is still pain.  This could represent an occult fracture despite negative radiographs.  Her significant pain could also be due to a history of narcotic dependence and she is on Suboxone 3 times daily.  PROCEDURES  Procedures   ED DIAGNOSES     ICD-10-CM   1. Contusion of right hand, initial encounter         B01.499U, MD 11/05/19 0021

## 2019-11-23 ENCOUNTER — Ambulatory Visit: Payer: Medicaid Other | Admitting: Podiatry

## 2019-11-30 ENCOUNTER — Encounter: Payer: Medicaid Other | Admitting: Podiatry

## 2019-11-30 ENCOUNTER — Ambulatory Visit: Payer: Medicaid Other

## 2019-12-14 ENCOUNTER — Ambulatory Visit: Payer: Medicaid Other | Admitting: Podiatry

## 2019-12-20 NOTE — Progress Notes (Signed)
This encounter was created in error - please disregard.

## 2021-04-23 ENCOUNTER — Emergency Department (HOSPITAL_BASED_OUTPATIENT_CLINIC_OR_DEPARTMENT_OTHER): Payer: Medicaid Other

## 2021-04-23 ENCOUNTER — Other Ambulatory Visit: Payer: Self-pay

## 2021-04-23 ENCOUNTER — Encounter (HOSPITAL_BASED_OUTPATIENT_CLINIC_OR_DEPARTMENT_OTHER): Payer: Self-pay | Admitting: Emergency Medicine

## 2021-04-23 DIAGNOSIS — W19XXXA Unspecified fall, initial encounter: Secondary | ICD-10-CM | POA: Insufficient documentation

## 2021-04-23 DIAGNOSIS — S6992XA Unspecified injury of left wrist, hand and finger(s), initial encounter: Secondary | ICD-10-CM | POA: Diagnosis present

## 2021-04-23 DIAGNOSIS — F17210 Nicotine dependence, cigarettes, uncomplicated: Secondary | ICD-10-CM | POA: Diagnosis not present

## 2021-04-23 DIAGNOSIS — I10 Essential (primary) hypertension: Secondary | ICD-10-CM | POA: Diagnosis not present

## 2021-04-23 DIAGNOSIS — M79642 Pain in left hand: Secondary | ICD-10-CM | POA: Diagnosis not present

## 2021-04-23 DIAGNOSIS — S60222A Contusion of left hand, initial encounter: Secondary | ICD-10-CM | POA: Insufficient documentation

## 2021-04-23 MED ORDER — OXYCODONE-ACETAMINOPHEN 5-325 MG PO TABS
1.0000 | ORAL_TABLET | ORAL | Status: DC | PRN
  Administered 2021-04-23: 1 via ORAL
  Filled 2021-04-23: qty 1
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
## 2021-04-24 ENCOUNTER — Emergency Department (HOSPITAL_BASED_OUTPATIENT_CLINIC_OR_DEPARTMENT_OTHER)
Admission: EM | Admit: 2021-04-24 | Discharge: 2021-04-24 | Disposition: A | Discharge status: Home / Self Care | Payer: Medicaid Other | Attending: Emergency Medicine | Admitting: Emergency Medicine

## 2021-04-24 DIAGNOSIS — S60222A Contusion of left hand, initial encounter: Secondary | ICD-10-CM

## 2021-04-24 DIAGNOSIS — W19XXXA Unspecified fall, initial encounter: Secondary | ICD-10-CM

## 2021-04-24 HISTORY — DX: Essential (primary) hypertension: I10

## 2021-04-24 MED ORDER — KETOROLAC TROMETHAMINE 60 MG/2ML IM SOLN
30.0000 mg | Freq: Once | INTRAMUSCULAR | Status: AC
  Administered 2021-04-24: 30 mg via INTRAMUSCULAR
  Filled 2021-04-24: qty 2
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
## 2021-04-24 NOTE — ED Notes (Signed)
Patient walked out prior to getting discharge orders.

## 2023-10-30 IMAGING — CR DG ELBOW COMPLETE 3+V*L*
4 series · 4 of 4 positions shown · non-contrast
Comparison: None.

CLINICAL DATA: Fall

EXAM:
LEFT ELBOW - COMPLETE 3+ VIEW

[x elbow joint ap left]
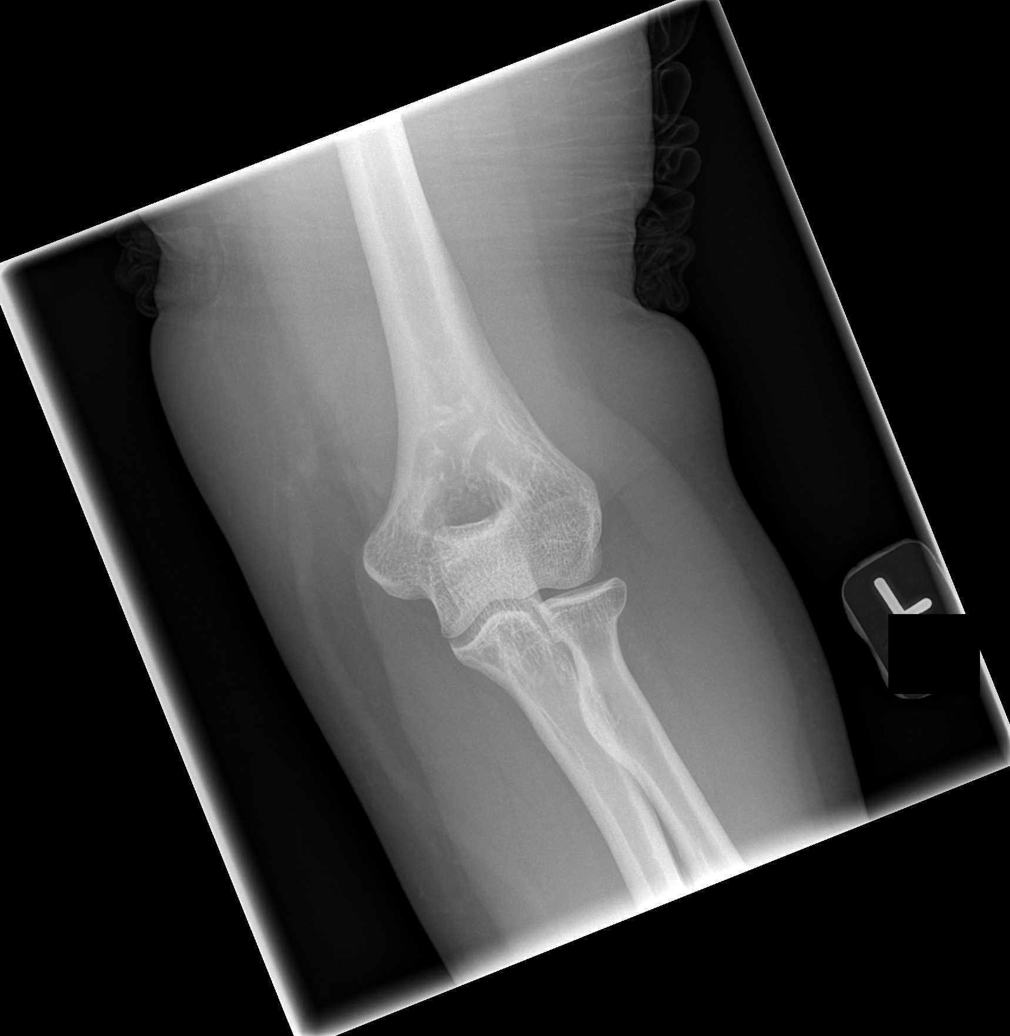

[x elbow joint obl. left (1 of 2)]
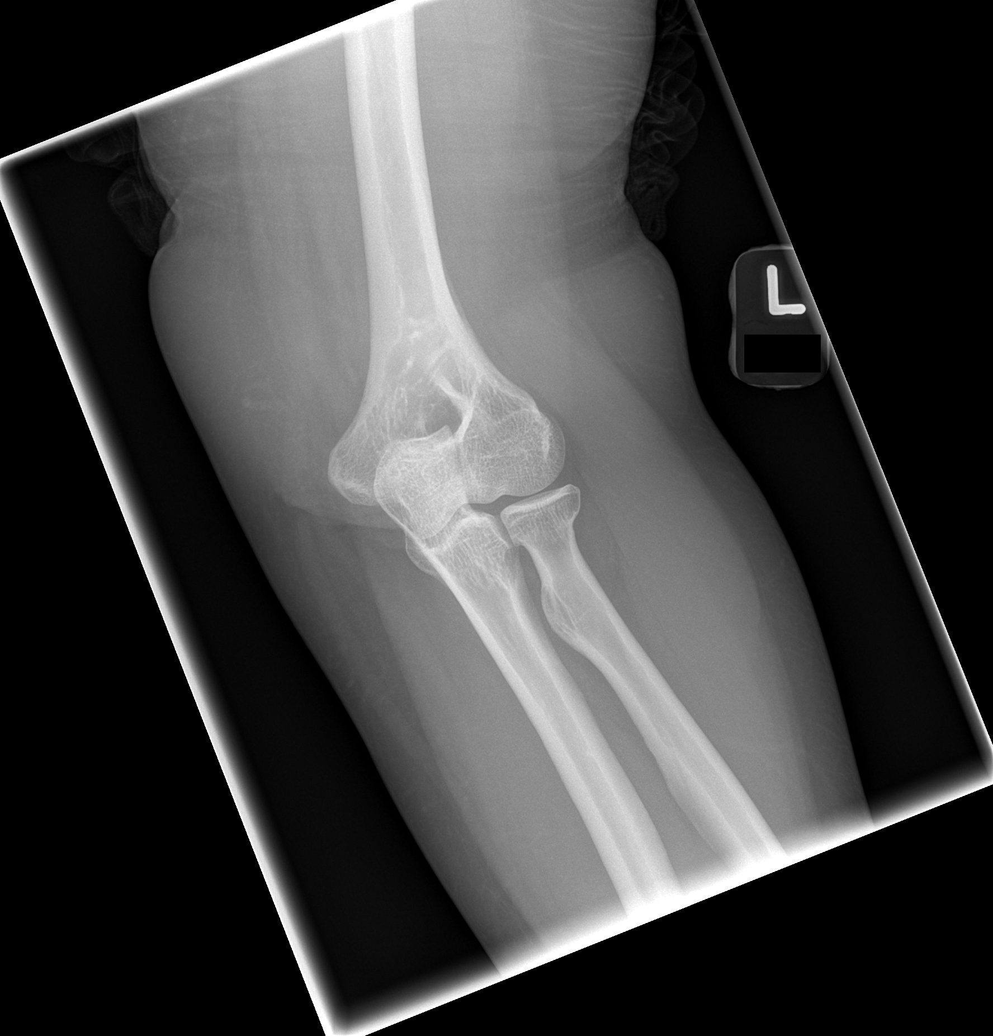

[x elbow joint obl. left (2 of 2)]
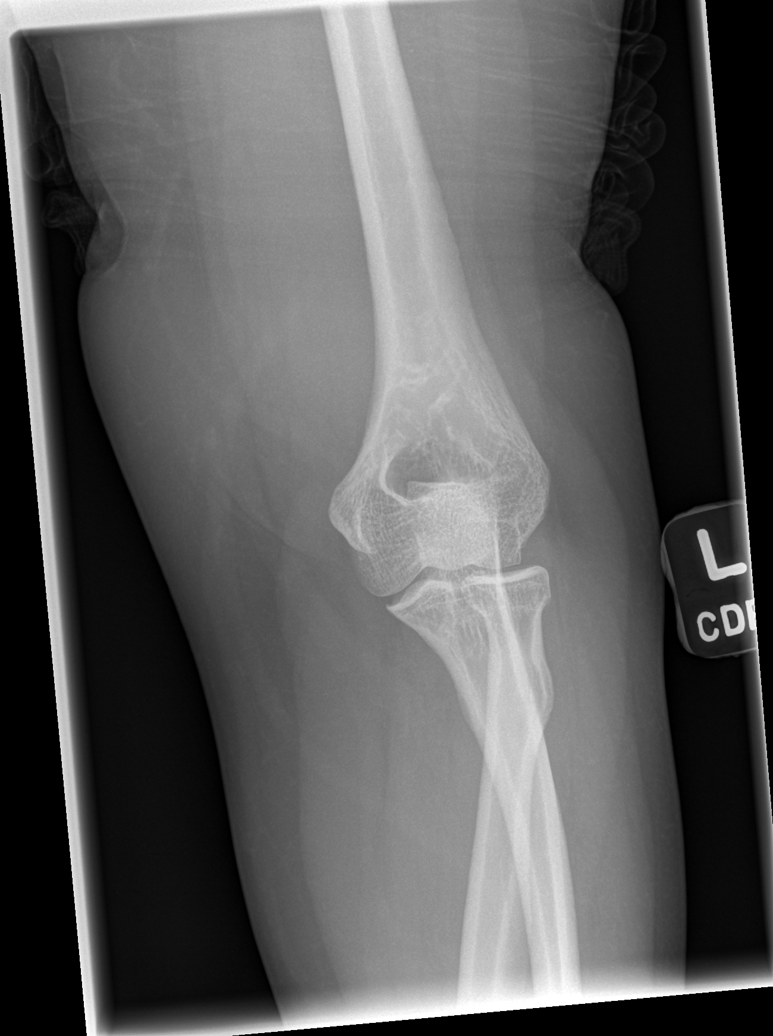

[x elbow joint lat left]
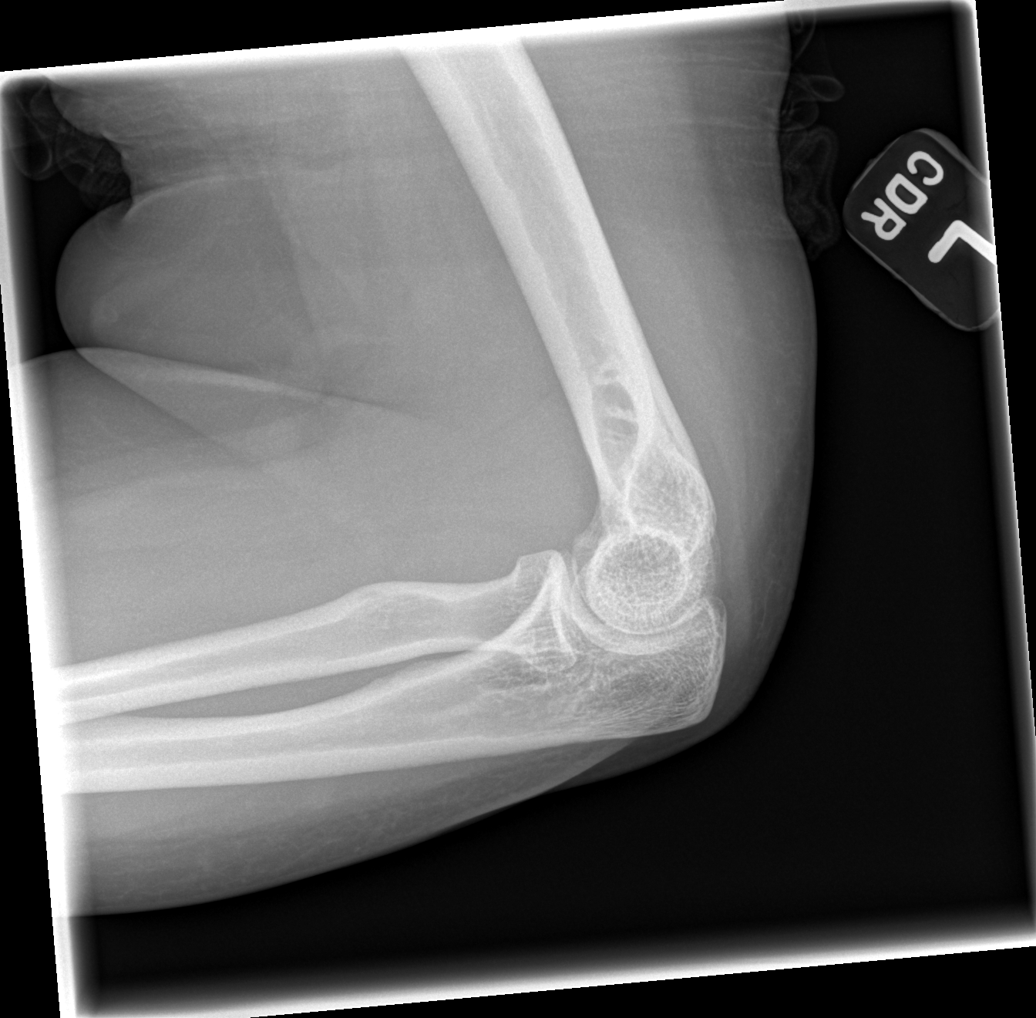

[4 of 4 positions shown; findings below may reference images not displayed]

FINDINGS: There is no evidence of fracture, dislocation, or joint effusion.
There is no evidence of arthropathy or other focal bone abnormality.
Soft tissues are unremarkable.
IMPRESSION: Negative.
